# Patient Record
Sex: Female | Born: 1945 | Race: Black or African American | Hispanic: No | Smoking: Never smoker
Health system: Southern US, Community
[De-identification: ages and names within clinical notes are randomized; demographics above are authoritative.]

## PROBLEM LIST (undated history)

## (undated) DIAGNOSIS — E119 Type 2 diabetes mellitus without complications: Secondary | ICD-10-CM

## (undated) DIAGNOSIS — C801 Malignant (primary) neoplasm, unspecified: Secondary | ICD-10-CM

## (undated) DIAGNOSIS — I10 Essential (primary) hypertension: Secondary | ICD-10-CM

## (undated) DIAGNOSIS — E7800 Pure hypercholesterolemia, unspecified: Secondary | ICD-10-CM

## (undated) DIAGNOSIS — M25569 Pain in unspecified knee: Secondary | ICD-10-CM

## (undated) DIAGNOSIS — M797 Fibromyalgia: Secondary | ICD-10-CM

## (undated) DIAGNOSIS — N289 Disorder of kidney and ureter, unspecified: Secondary | ICD-10-CM

## (undated) DIAGNOSIS — G8929 Other chronic pain: Secondary | ICD-10-CM

## (undated) HISTORY — PX: TUBAL LIGATION: SHX77

## (undated) HISTORY — PX: ABDOMINAL HYSTERECTOMY: SHX81

---

## 2003-11-25 ENCOUNTER — Ambulatory Visit: Payer: Self-pay | Admitting: Family Medicine

## 2005-05-23 ENCOUNTER — Ambulatory Visit: Payer: Self-pay | Admitting: Family Medicine

## 2010-05-16 ENCOUNTER — Ambulatory Visit: Payer: Self-pay | Admitting: Internal Medicine

## 2012-01-08 DIAGNOSIS — C801 Malignant (primary) neoplasm, unspecified: Secondary | ICD-10-CM

## 2012-01-08 HISTORY — DX: Malignant (primary) neoplasm, unspecified: C80.1

## 2013-08-27 ENCOUNTER — Inpatient Hospital Stay (HOSPITAL_COMMUNITY): Payer: Medicare Other

## 2013-08-27 ENCOUNTER — Emergency Department (HOSPITAL_COMMUNITY): Payer: Medicare Other

## 2013-08-27 ENCOUNTER — Inpatient Hospital Stay (HOSPITAL_COMMUNITY)
Admission: EM | Admit: 2013-08-27 | Discharge: 2013-09-07 | DRG: 374 | Disposition: E | Payer: Medicare Other | Attending: Emergency Medicine | Admitting: Emergency Medicine

## 2013-08-27 ENCOUNTER — Encounter (HOSPITAL_COMMUNITY): Payer: Self-pay | Admitting: Emergency Medicine

## 2013-08-27 DIAGNOSIS — I469 Cardiac arrest, cause unspecified: Secondary | ICD-10-CM | POA: Diagnosis present

## 2013-08-27 DIAGNOSIS — R6520 Severe sepsis without septic shock: Secondary | ICD-10-CM

## 2013-08-27 DIAGNOSIS — E875 Hyperkalemia: Secondary | ICD-10-CM

## 2013-08-27 DIAGNOSIS — IMO0001 Reserved for inherently not codable concepts without codable children: Secondary | ICD-10-CM | POA: Diagnosis present

## 2013-08-27 DIAGNOSIS — N185 Chronic kidney disease, stage 5: Secondary | ICD-10-CM | POA: Diagnosis present

## 2013-08-27 DIAGNOSIS — N2889 Other specified disorders of kidney and ureter: Secondary | ICD-10-CM | POA: Diagnosis present

## 2013-08-27 DIAGNOSIS — K56609 Unspecified intestinal obstruction, unspecified as to partial versus complete obstruction: Secondary | ICD-10-CM | POA: Diagnosis present

## 2013-08-27 DIAGNOSIS — J449 Chronic obstructive pulmonary disease, unspecified: Secondary | ICD-10-CM | POA: Diagnosis present

## 2013-08-27 DIAGNOSIS — D630 Anemia in neoplastic disease: Secondary | ICD-10-CM | POA: Diagnosis present

## 2013-08-27 DIAGNOSIS — K559 Vascular disorder of intestine, unspecified: Secondary | ICD-10-CM | POA: Diagnosis not present

## 2013-08-27 DIAGNOSIS — Z923 Personal history of irradiation: Secondary | ICD-10-CM | POA: Diagnosis not present

## 2013-08-27 DIAGNOSIS — M25569 Pain in unspecified knee: Secondary | ICD-10-CM | POA: Diagnosis present

## 2013-08-27 DIAGNOSIS — E8729 Other acidosis: Secondary | ICD-10-CM

## 2013-08-27 DIAGNOSIS — C7800 Secondary malignant neoplasm of unspecified lung: Secondary | ICD-10-CM | POA: Diagnosis present

## 2013-08-27 DIAGNOSIS — Z794 Long term (current) use of insulin: Secondary | ICD-10-CM

## 2013-08-27 DIAGNOSIS — K5900 Constipation, unspecified: Secondary | ICD-10-CM | POA: Diagnosis present

## 2013-08-27 DIAGNOSIS — R6521 Severe sepsis with septic shock: Secondary | ICD-10-CM

## 2013-08-27 DIAGNOSIS — E1129 Type 2 diabetes mellitus with other diabetic kidney complication: Secondary | ICD-10-CM

## 2013-08-27 DIAGNOSIS — J690 Pneumonitis due to inhalation of food and vomit: Secondary | ICD-10-CM | POA: Diagnosis not present

## 2013-08-27 DIAGNOSIS — R5383 Other fatigue: Secondary | ICD-10-CM | POA: Diagnosis not present

## 2013-08-27 DIAGNOSIS — N390 Urinary tract infection, site not specified: Secondary | ICD-10-CM | POA: Diagnosis present

## 2013-08-27 DIAGNOSIS — A419 Sepsis, unspecified organism: Secondary | ICD-10-CM | POA: Diagnosis not present

## 2013-08-27 DIAGNOSIS — I959 Hypotension, unspecified: Secondary | ICD-10-CM | POA: Diagnosis present

## 2013-08-27 DIAGNOSIS — C772 Secondary and unspecified malignant neoplasm of intra-abdominal lymph nodes: Secondary | ICD-10-CM | POA: Diagnosis present

## 2013-08-27 DIAGNOSIS — I120 Hypertensive chronic kidney disease with stage 5 chronic kidney disease or end stage renal disease: Secondary | ICD-10-CM | POA: Diagnosis present

## 2013-08-27 DIAGNOSIS — Z7982 Long term (current) use of aspirin: Secondary | ICD-10-CM

## 2013-08-27 DIAGNOSIS — C787 Secondary malignant neoplasm of liver and intrahepatic bile duct: Secondary | ICD-10-CM | POA: Diagnosis present

## 2013-08-27 DIAGNOSIS — E872 Acidosis, unspecified: Secondary | ICD-10-CM | POA: Diagnosis present

## 2013-08-27 DIAGNOSIS — J9600 Acute respiratory failure, unspecified whether with hypoxia or hypercapnia: Secondary | ICD-10-CM | POA: Diagnosis present

## 2013-08-27 DIAGNOSIS — C786 Secondary malignant neoplasm of retroperitoneum and peritoneum: Secondary | ICD-10-CM | POA: Diagnosis present

## 2013-08-27 DIAGNOSIS — Z66 Do not resuscitate: Secondary | ICD-10-CM | POA: Diagnosis present

## 2013-08-27 DIAGNOSIS — C773 Secondary and unspecified malignant neoplasm of axilla and upper limb lymph nodes: Secondary | ICD-10-CM | POA: Diagnosis present

## 2013-08-27 DIAGNOSIS — Z888 Allergy status to other drugs, medicaments and biological substances status: Secondary | ICD-10-CM | POA: Diagnosis not present

## 2013-08-27 DIAGNOSIS — Z8542 Personal history of malignant neoplasm of other parts of uterus: Secondary | ICD-10-CM | POA: Diagnosis not present

## 2013-08-27 DIAGNOSIS — R627 Adult failure to thrive: Secondary | ICD-10-CM | POA: Diagnosis present

## 2013-08-27 DIAGNOSIS — N1330 Unspecified hydronephrosis: Secondary | ICD-10-CM | POA: Diagnosis present

## 2013-08-27 DIAGNOSIS — N170 Acute kidney failure with tubular necrosis: Secondary | ICD-10-CM | POA: Diagnosis not present

## 2013-08-27 DIAGNOSIS — J4489 Other specified chronic obstructive pulmonary disease: Secondary | ICD-10-CM | POA: Diagnosis present

## 2013-08-27 DIAGNOSIS — D631 Anemia in chronic kidney disease: Secondary | ICD-10-CM

## 2013-08-27 DIAGNOSIS — R5381 Other malaise: Secondary | ICD-10-CM | POA: Diagnosis present

## 2013-08-27 DIAGNOSIS — N039 Chronic nephritic syndrome with unspecified morphologic changes: Secondary | ICD-10-CM | POA: Diagnosis present

## 2013-08-27 DIAGNOSIS — E785 Hyperlipidemia, unspecified: Secondary | ICD-10-CM | POA: Diagnosis present

## 2013-08-27 DIAGNOSIS — I9589 Other hypotension: Secondary | ICD-10-CM

## 2013-08-27 DIAGNOSIS — N19 Unspecified kidney failure: Secondary | ICD-10-CM

## 2013-08-27 DIAGNOSIS — E1122 Type 2 diabetes mellitus with diabetic chronic kidney disease: Secondary | ICD-10-CM

## 2013-08-27 DIAGNOSIS — G8929 Other chronic pain: Secondary | ICD-10-CM | POA: Diagnosis present

## 2013-08-27 DIAGNOSIS — N189 Chronic kidney disease, unspecified: Secondary | ICD-10-CM

## 2013-08-27 DIAGNOSIS — D649 Anemia, unspecified: Secondary | ICD-10-CM

## 2013-08-27 DIAGNOSIS — N179 Acute kidney failure, unspecified: Secondary | ICD-10-CM

## 2013-08-27 DIAGNOSIS — E119 Type 2 diabetes mellitus without complications: Secondary | ICD-10-CM | POA: Diagnosis present

## 2013-08-27 HISTORY — DX: Disorder of kidney and ureter, unspecified: N28.9

## 2013-08-27 HISTORY — DX: Fibromyalgia: M79.7

## 2013-08-27 HISTORY — DX: Type 2 diabetes mellitus without complications: E11.9

## 2013-08-27 HISTORY — DX: Other chronic pain: G89.29

## 2013-08-27 HISTORY — DX: Malignant (primary) neoplasm, unspecified: C80.1

## 2013-08-27 HISTORY — DX: Pain in unspecified knee: M25.569

## 2013-08-27 HISTORY — DX: Essential (primary) hypertension: I10

## 2013-08-27 HISTORY — DX: Pure hypercholesterolemia, unspecified: E78.00

## 2013-08-27 LAB — CBC WITH DIFFERENTIAL/PLATELET
BASOS PCT: 0 % (ref 0–1)
Basophils Absolute: 0 10*3/uL (ref 0.0–0.1)
EOS PCT: 0 % (ref 0–5)
Eosinophils Absolute: 0 10*3/uL (ref 0.0–0.7)
HCT: 18 % — ABNORMAL LOW (ref 36.0–46.0)
HEMOGLOBIN: 5.6 g/dL — AB (ref 12.0–15.0)
Lymphocytes Relative: 7 % — ABNORMAL LOW (ref 12–46)
Lymphs Abs: 0.7 10*3/uL (ref 0.7–4.0)
MCH: 23.3 pg — ABNORMAL LOW (ref 26.0–34.0)
MCHC: 31.1 g/dL (ref 30.0–36.0)
MCV: 75 fL — ABNORMAL LOW (ref 78.0–100.0)
MONO ABS: 1.4 10*3/uL — AB (ref 0.1–1.0)
Monocytes Relative: 15 % — ABNORMAL HIGH (ref 3–12)
NEUTROS PCT: 78 % — AB (ref 43–77)
Neutro Abs: 7.2 10*3/uL (ref 1.7–7.7)
Platelets: 342 10*3/uL (ref 150–400)
RBC: 2.4 MIL/uL — AB (ref 3.87–5.11)
RDW: 16.8 % — ABNORMAL HIGH (ref 11.5–15.5)
WBC Morphology: INCREASED
WBC: 9.3 10*3/uL (ref 4.0–10.5)

## 2013-08-27 LAB — COMPREHENSIVE METABOLIC PANEL
ALBUMIN: 2.3 g/dL — AB (ref 3.5–5.2)
ALK PHOS: 90 U/L (ref 39–117)
ALT: 6 U/L (ref 0–35)
AST: 14 U/L (ref 0–37)
Anion gap: 30 — ABNORMAL HIGH (ref 5–15)
BUN: 163 mg/dL — ABNORMAL HIGH (ref 6–23)
CALCIUM: 7.9 mg/dL — AB (ref 8.4–10.5)
CO2: 13 mEq/L — ABNORMAL LOW (ref 19–32)
Chloride: 98 mEq/L (ref 96–112)
Creatinine, Ser: 11.24 mg/dL — ABNORMAL HIGH (ref 0.50–1.10)
GFR calc non Af Amer: 3 mL/min — ABNORMAL LOW (ref >90)
GFR, EST AFRICAN AMERICAN: 4 mL/min — AB (ref >90)
GLUCOSE: 144 mg/dL — AB (ref 70–99)
Potassium: 6.8 mEq/L (ref 3.7–5.3)
SODIUM: 141 meq/L (ref 137–147)
TOTAL PROTEIN: 6 g/dL (ref 6.0–8.3)
Total Bilirubin: 0.6 mg/dL (ref 0.3–1.2)

## 2013-08-27 LAB — PREPARE RBC (CROSSMATCH)

## 2013-08-27 LAB — BASIC METABOLIC PANEL
ANION GAP: 30 — AB (ref 5–15)
BUN: 164 mg/dL — ABNORMAL HIGH (ref 6–23)
CHLORIDE: 100 meq/L (ref 96–112)
CO2: 11 mEq/L — ABNORMAL LOW (ref 19–32)
Calcium: 8.4 mg/dL (ref 8.4–10.5)
Creatinine, Ser: 10.91 mg/dL — ABNORMAL HIGH (ref 0.50–1.10)
GFR, EST AFRICAN AMERICAN: 4 mL/min — AB (ref >90)
GFR, EST NON AFRICAN AMERICAN: 3 mL/min — AB (ref >90)
Glucose, Bld: 105 mg/dL — ABNORMAL HIGH (ref 70–99)
POTASSIUM: 6.6 meq/L — AB (ref 3.7–5.3)
SODIUM: 141 meq/L (ref 137–147)

## 2013-08-27 LAB — URINE MICROSCOPIC-ADD ON

## 2013-08-27 LAB — CBG MONITORING, ED
GLUCOSE-CAPILLARY: 73 mg/dL (ref 70–99)
Glucose-Capillary: 70 mg/dL (ref 70–99)

## 2013-08-27 LAB — URINALYSIS, ROUTINE W REFLEX MICROSCOPIC
Glucose, UA: NEGATIVE mg/dL
Ketones, ur: NEGATIVE mg/dL
Nitrite: NEGATIVE
PH: 5 (ref 5.0–8.0)
Protein, ur: 100 mg/dL — AB
Specific Gravity, Urine: 1.019 (ref 1.005–1.030)
UROBILINOGEN UA: 0.2 mg/dL (ref 0.0–1.0)

## 2013-08-27 LAB — I-STAT CG4 LACTIC ACID, ED: LACTIC ACID, VENOUS: 1 mmol/L (ref 0.5–2.2)

## 2013-08-27 LAB — ABO/RH: ABO/RH(D): A POS

## 2013-08-27 LAB — PHOSPHORUS: PHOSPHORUS: 10.8 mg/dL — AB (ref 2.3–4.6)

## 2013-08-27 LAB — GLUCOSE, CAPILLARY
GLUCOSE-CAPILLARY: 81 mg/dL (ref 70–99)
GLUCOSE-CAPILLARY: 121 mg/dL — AB (ref 70–99)

## 2013-08-27 LAB — MRSA PCR SCREENING: MRSA by PCR: POSITIVE — AB

## 2013-08-27 LAB — TROPONIN I

## 2013-08-27 LAB — I-STAT TROPONIN, ED: TROPONIN I, POC: 0 ng/mL (ref 0.00–0.08)

## 2013-08-27 MED ORDER — ONDANSETRON HCL 4 MG/2ML IJ SOLN: 4.0000 mg | Freq: Four times a day (QID) | INTRAMUSCULAR | Status: DC | PRN

## 2013-08-27 MED ORDER — SODIUM CHLORIDE 0.9 % IV BOLUS (SEPSIS)
1000.0000 mL | Freq: Once | INTRAVENOUS | Status: AC
  Administered 2013-08-27: 1000 mL via INTRAVENOUS

## 2013-08-27 MED ORDER — CHLORHEXIDINE GLUCONATE CLOTH 2 % EX PADS
6.0000 | MEDICATED_PAD | Freq: Every day | CUTANEOUS | Status: DC
  Administered 2013-08-28 – 2013-08-29 (×2): 6 via TOPICAL

## 2013-08-27 MED ORDER — MUPIROCIN 2 % EX OINT
1.0000 "application " | TOPICAL_OINTMENT | Freq: Two times a day (BID) | CUTANEOUS | Status: DC
  Administered 2013-08-27 – 2013-08-29 (×4): 1 via NASAL
  Filled 2013-08-27: qty 22

## 2013-08-27 MED ORDER — ACETAMINOPHEN 650 MG RE SUPP: 650.0000 mg | Freq: Four times a day (QID) | RECTAL | Status: DC | PRN

## 2013-08-27 MED ORDER — FUROSEMIDE 10 MG/ML IJ SOLN
120.0000 mg | Freq: Once | INTRAVENOUS | Status: AC
  Administered 2013-08-27: 120 mg via INTRAVENOUS
  Filled 2013-08-27: qty 12

## 2013-08-27 MED ORDER — CEFAZOLIN SODIUM 1-5 GM-% IV SOLN
1.0000 g | INTRAVENOUS | Status: DC
  Filled 2013-08-27: qty 50

## 2013-08-27 MED ORDER — INSULIN ASPART 100 UNIT/ML ~~LOC~~ SOLN
0.0000 [IU] | Freq: Three times a day (TID) | SUBCUTANEOUS | Status: DC
  Administered 2013-08-28: 2 [IU] via SUBCUTANEOUS
  Administered 2013-08-28: 1 [IU] via SUBCUTANEOUS
  Administered 2013-08-29 (×2): 2 [IU] via SUBCUTANEOUS

## 2013-08-27 MED ORDER — HEPARIN SODIUM (PORCINE) 5000 UNIT/ML IJ SOLN
5000.0000 [IU] | Freq: Three times a day (TID) | INTRAMUSCULAR | Status: DC
  Administered 2013-08-27 – 2013-08-29 (×5): 5000 [IU] via SUBCUTANEOUS
  Filled 2013-08-27 (×7): qty 1

## 2013-08-27 MED ORDER — SODIUM CHLORIDE 0.9 % IV SOLN
10.0000 mL/h | Freq: Once | INTRAVENOUS | Status: AC
  Administered 2013-08-27: 10 mL/h via INTRAVENOUS

## 2013-08-27 MED ORDER — SODIUM POLYSTYRENE SULFONATE 15 GM/60ML PO SUSP
60.0000 g | Freq: Once | ORAL | Status: AC
  Administered 2013-08-27: 60 g via ORAL
  Filled 2013-08-27 (×2): qty 240

## 2013-08-27 MED ORDER — SODIUM BICARBONATE 8.4 % IV SOLN
50.0000 meq | Freq: Once | INTRAVENOUS | Status: AC
  Administered 2013-08-27: 50 meq via INTRAVENOUS
  Filled 2013-08-27: qty 50

## 2013-08-27 MED ORDER — SODIUM CHLORIDE 0.9 % IJ SOLN
3.0000 mL | Freq: Two times a day (BID) | INTRAMUSCULAR | Status: DC
  Administered 2013-08-28: 3 mL via INTRAVENOUS

## 2013-08-27 MED ORDER — DEXTROSE 5 % IV SOLN
1.0000 g | INTRAVENOUS | Status: DC
  Administered 2013-08-27: 1 g via INTRAVENOUS
  Filled 2013-08-27: qty 10

## 2013-08-27 MED ORDER — DEXTROSE 50 % IV SOLN
1.0000 | Freq: Once | INTRAVENOUS | Status: AC
  Administered 2013-08-27: 50 mL via INTRAVENOUS
  Filled 2013-08-27: qty 50

## 2013-08-27 MED ORDER — SODIUM CHLORIDE 0.9 % IV SOLN
1.0000 g | Freq: Once | INTRAVENOUS | Status: AC
  Administered 2013-08-27: 1 g via INTRAVENOUS
  Filled 2013-08-27: qty 10

## 2013-08-27 MED ORDER — ASPIRIN EC 81 MG PO TBEC
81.0000 mg | DELAYED_RELEASE_TABLET | Freq: Every day | ORAL | Status: DC
  Administered 2013-08-27: 81 mg via ORAL
  Filled 2013-08-27 (×3): qty 1

## 2013-08-27 MED ORDER — PANTOPRAZOLE SODIUM 40 MG IV SOLR
40.0000 mg | INTRAVENOUS | Status: DC
  Administered 2013-08-27: 40 mg via INTRAVENOUS
  Filled 2013-08-27: qty 40

## 2013-08-27 MED ORDER — SODIUM CHLORIDE 0.9 % IV BOLUS (SEPSIS): 250.0000 mL | INTRAVENOUS | Status: DC | PRN

## 2013-08-27 MED ORDER — INSULIN ASPART 100 UNIT/ML ~~LOC~~ SOLN: 0.0000 [IU] | Freq: Three times a day (TID) | SUBCUTANEOUS | Status: DC

## 2013-08-27 MED ORDER — ACETAMINOPHEN 325 MG PO TABS: 650.0000 mg | ORAL_TABLET | Freq: Four times a day (QID) | ORAL | Status: DC | PRN

## 2013-08-27 MED ORDER — INSULIN ASPART 100 UNIT/ML ~~LOC~~ SOLN: 3.0000 [IU] | Freq: Three times a day (TID) | SUBCUTANEOUS | Status: DC

## 2013-08-27 MED ORDER — ALBUTEROL SULFATE (2.5 MG/3ML) 0.083% IN NEBU
5.0000 mg | INHALATION_SOLUTION | Freq: Once | RESPIRATORY_TRACT | Status: AC
  Administered 2013-08-27: 5 mg via RESPIRATORY_TRACT
  Filled 2013-08-27: qty 6

## 2013-08-27 MED ORDER — INSULIN ASPART 100 UNIT/ML ~~LOC~~ SOLN: 0.0000 [IU] | Freq: Every day | SUBCUTANEOUS | Status: DC

## 2013-08-27 MED ORDER — INSULIN ASPART 100 UNIT/ML ~~LOC~~ SOLN
10.0000 [IU] | Freq: Once | SUBCUTANEOUS | Status: AC
  Administered 2013-08-27: 10 [IU] via INTRAVENOUS
  Filled 2013-08-27: qty 1

## 2013-08-27 MED ORDER — ONDANSETRON HCL 4 MG PO TABS: 4.0000 mg | ORAL_TABLET | Freq: Four times a day (QID) | ORAL | Status: DC | PRN

## 2013-08-27 MED ORDER — SODIUM BICARBONATE 8.4 % IV SOLN
25.0000 meq | Freq: Once | INTRAVENOUS | Status: DC
  Filled 2013-08-27: qty 50

## 2013-08-27 MED ORDER — FENTANYL CITRATE 0.05 MG/ML IJ SOLN
50.0000 ug | Freq: Once | INTRAMUSCULAR | Status: AC
  Administered 2013-08-27: 50 ug via INTRAVENOUS
  Filled 2013-08-27: qty 2

## 2013-08-27 MED ORDER — SODIUM POLYSTYRENE SULFONATE 15 GM/60ML PO SUSP
30.0000 g | Freq: Once | ORAL | Status: AC
  Administered 2013-08-28: 30 g via ORAL
  Filled 2013-08-27: qty 120

## 2013-08-27 NOTE — ED Notes (Signed)
Potassium 6.8. Dr. Leonides Schanz made aware

## 2013-08-27 NOTE — ED Notes (Addendum)
MD at bedside. 

## 2013-08-27 NOTE — ED Notes (Signed)
Lab results given to Dr.Ward. 

## 2013-08-27 NOTE — ED Notes (Addendum)
Romero Belling, RRRN regarding bed placement. States OK for stepdown. Jillian 3SRN updated.

## 2013-08-27 NOTE — Consult Note (Signed)
PULMONARY / CRITICAL CARE MEDICINE   Name: Wanda Ferguson MRN: [id number] DOB: December 20, 1945    ADMISSION DATE:  08/26/2013 CONSULTATION DATE:  8/21  REFERRING MD :  [facility]  INITIAL PRESENTATION:  49F with hx of DM, Htn and CKD (followed @ [facility]) admitted initially to [facility] service via ED with CC of weakness and fatigue and found to have worsening renal function, markedly elevated BUN and Cr, hyperkalemia(6.8), severe anemia (Hgb 5.6) and hypotension that improved with initiation of PRBCs. Due to constellation of problems, it was felt that she should be admitted to ICU. Therefore PCCM consult obtained  STUDIES:  8/21 Renal US:   SIGNIFICANT EVENTS:    HISTORY OF PRESENT ILLNESS:   49F with hx of DM, Htn and CKD (followed @ [facility]) admitted initially to [facility] service via ED with CC of weakness and fatigue and found to have worsening renal function, markedly elevated BUN and Cr, hyperkalemia(6.8), severe anemia (Hgb 5.6) and hypotension that improved with initiation of PRBCs. Due to constellation of problems, it was felt that she should be admitted to ICU. Therefore PCCM consult obtained  PAST MEDICAL HISTORY :  Past Medical History  Diagnosis Date  . Fibromyalgia   . Chronic knee pain   . Hypertension   . Renal disorder     stage 5 CKD  . Hypercholesteremia   . Diabetes mellitus without complication   . Cancer 2014    uterine   Past Surgical History  Procedure Laterality Date  . Abdominal hysterectomy    . Tubal ligation     Prior to Admission medications   Medication Sig Start Date End Date Taking? Authorizing Provider  amLODipine (NORVASC) 10 MG tablet Take 10 mg by mouth daily.   Yes Historical Provider, MD  aspirin EC 81 MG tablet Take 81 mg by mouth at bedtime.   Yes Historical Provider, MD  atenolol (TENORMIN) 100 MG tablet Take 100 mg by mouth daily.   Yes Historical Provider, MD  HYDRALAZINE HCL PO Take 1 tablet by mouth 3 (three) times daily.   Yes Historical Provider, MD   hydrochlorothiazide (HYDRODIURIL) 25 MG tablet Take 25 mg by mouth daily.   Yes Historical Provider, MD  insulin glargine (LANTUS) 100 UNIT/ML injection Inject 0-18 Units into the skin at bedtime.   Yes Historical Provider, MD  oxyCODONE-acetaminophen (PERCOCET) 10-325 MG per tablet Take 1 tablet by mouth every 3 (three) hours as needed for pain.   Yes Historical Provider, MD  sodium bicarbonate 650 MG tablet Take 650 mg by mouth 2 (two) times daily.   Yes Historical Provider, MD   Allergies  Allergen Reactions  . Zoloft [Sertraline Hcl]     Nausea /vomiting    FAMILY HISTORY:  No family history on file. SOCIAL HISTORY:  reports that she has never smoked. She does not have any smokeless tobacco history on file. She reports that she drinks alcohol. She reports that she does not use illicit drugs.  REVIEW OF SYSTEMS:  As per admission note. No palpitations or LOC. No fever, cough, resp secretions, hemoptysis, LE edema  SUBJECTIVE:   VITAL SIGNS: Temp:  [97.4 F (36.3 C)-98.3 F (36.8 C)] 97.5 F (36.4 C) (08/21 1610) Pulse Rate:  [64-70] 66 (08/21 1610) Resp:  [17-31] 21 (08/21 1610) BP: (83-114)/(39-61) 109/61 mmHg (08/21 1610) SpO2:  [93 %-100 %] 94 % (08/21 1610) HEMODYNAMICS:   VENTILATOR SETTINGS:   INTAKE / OUTPUT:  Intake/Output Summary (Last 24 hours) at 08/18/2013 1620 Last data filed  at 08/16/2013 1351  Gross per 24 hour  Intake    335 ml  Output      0 ml  Net    335 ml    PHYSICAL EXAMINATION: General:  NAD, oriented, + F/C Neuro: CNs intact, no focal deficits HEENT:  NCAT, PERRL Cardiovascular: reg, no M Lungs: diminished BS, no adventitious sounds Abdomen: obese, soft, NT, NABS Ext: warm, trace symmetric edema  LABS:  CBC  Recent Labs Lab 09/02/2013 1147  WBC 9.3  HGB 5.6*  HCT 18.0*  PLT 342   Coag's No results found for this basename: APTT, INR,  in the last 168 hours BMET  Recent Labs Lab 08/13/2013 1147  NA 141  K 6.8*  CL 98  CO2  13*  BUN 163*  CREATININE 11.24*  GLUCOSE 144*   Electrolytes  Recent Labs Lab 08/18/2013 1147  CALCIUM 7.9*   Sepsis Markers  Recent Labs Lab 08/24/2013 1200  LATICACIDVEN 1.00   ABG No results found for this basename: PHART, PCO2ART, PO2ART,  in the last 168 hours Liver Enzymes  Recent Labs Lab 08/19/2013 1147  AST 14  ALT 6  ALKPHOS 90  BILITOT 0.6  ALBUMIN 2.3*   Cardiac Enzymes No results found for this basename: TROPONINI, PROBNP,  in the last 168 hours Glucose  Recent Labs Lab 08/25/2013 1517  GLUCAP 70    CXR: low volumes, elevated L hemi-diaphragm, no overt edema or infiltrates, possible R suprahilar LAN, possible nodules bilaterally  ASSESSMENT / PLAN:  PULMONARY  A: Possible pulm nodules and LAN At risk for pulm edema with transfusion in setting of renal failure P:   SuppO2 as needed to maintain SpO2 > 92% PA/lat CXR when able Consider CT chest if CXR findings persist  CARDIOVASCULAR  A:  Hypotension, resolved EKG abnormalities - likely related to hyperkalemia P:  Monitor BP, rhythm Cycle cardiac markers  RENAL A:   Stage V CKD AKI, unclear etiology Acute on chronic metabolic acidosis due to renal failure Hyperkalemia P:   Medical rx for hyperkalemia given in ED Renal Consult obtained Will likely need initiation of HD Vasc Surgery contacted for HD acces by Renal Monitor BMET closely Monitor I/Os Correct electrolytes as indicated  GASTROINTESTINAL A:   ileus by KUB (exam unimpressive) P:   SUP: IV PPI Renal diet ordered Repeat KUB in AM 8/22  HEMATOLOGIC A:   Acute on chronic anemia without overt bleeding noted P:  2 units PRBCs ordered 8/21 DVT px: SQ heparin Monitor CBC intermittently Transfuse per usual ICU guidelines Hemoccult stools  INFECTIOUS A:  No overt infections identified Urine 8/21 >>  Blood 8/21 >>  P:   Monitor temp, WBC count Micro and abx as above  ENDOCRINE A:   DM2   P:   ACHS moderate  SSI scale with meal coverage  NEUROLOGIC A:   Adult FTT due to worsening renal failure P:   RASS goal: 0 Minimize sedation/opioids  TODAY'S SUMMARY:   I have personally obtained a history, examined the patient, evaluated laboratory and imaging results, formulated the assessment and plan and placed orders. CRITICAL CARE: The patient is critically ill with multiple organ systems failure and requires high complexity decision making for assessment and support, frequent evaluation and titration of therapies, application of advanced monitoring technologies and extensive interpretation of multiple databases. Critical Care Time devoted to patient care services described in this note is  minutes.   Merton Border, MD ; [facility] [phone].  After 5:30  PM or weekends, call [phone] Pulmonary and [place] Pager: [phone]  08/11/2013, 4:20 PM

## 2013-08-27 NOTE — ED Notes (Signed)
Patient offered use of bed pan but stated she did not feel the need to urinate at this time.

## 2013-08-27 NOTE — ED Notes (Signed)
Attempted report 

## 2013-08-27 NOTE — ED Notes (Signed)
Re-paged Dr. Marin Olp to [phone]

## 2013-08-27 NOTE — Consult Note (Signed)
Reason for Consult:Renal Failure/ Hyperkalemia Referring Physician: Pryor Curia, DO  Wanda Ferguson is an 68 y.o. female.  HPI: Patient with history of CKD Stage 5 (not on dialysis, no access), Uterine CA (S/p surgical resection in 2013 and XRT), HTN, IDDM, Chronic knee pain, Fibromyalgia.   Her Daughter is at her bedside and assist with history.  She presents with a 3 day history of worsening fatigue and malaise associated with nausea, vomiting x2 (NBNB, yesterday), dysuria and foul smelling urine. She reports she previously followed with Dr. Kathee Delton Nephrologist at [facility] and there were discussions about possible need for dialysis but never saw a vascular surgeon.  She has continued to follow with Dr. Georgette Dover for Hx of Uterine CA and last saw him 4 months ago.  She apparently has not seen a nephrologist again agter Dr. Kathee Delton retired over a year ago.  Her daughter reports that her previous renal function was "12%."  She denies any decreased UOP and decreased ability to urinate.  Trend in Creatinine: Creatinine, Ser  Date/Time Value Ref Range Status  08/10/2013 11:47 AM 11.24* 0.50 - 1.10 mg/dL Final    PMH:   Past Medical History  Diagnosis Date  . Fibromyalgia   . Chronic knee pain   . Hypertension   . Renal disorder     stage 5 CKD  . Hypercholesteremia   . Diabetes mellitus without complication   . Cancer 2014    uterine    PSH:   Past Surgical History  Procedure Laterality Date  . Abdominal hysterectomy    . Tubal ligation      Allergies:  Allergies  Allergen Reactions  . Zoloft [Sertraline Hcl]     Nausea /vomiting    Medications:   Prior to Admission medications   Medication Sig Start Date End Date Taking? Authorizing Provider  amLODipine (NORVASC) 10 MG tablet Take 10 mg by mouth daily.   Yes Historical Provider, MD  aspirin EC 81 MG tablet Take 81 mg by mouth at bedtime.   Yes Historical Provider, MD  atenolol (TENORMIN) 100 MG tablet Take 100 mg by mouth daily.    Yes Historical Provider, MD  HYDRALAZINE HCL PO Take 1 tablet by mouth 3 (three) times daily.   Yes Historical Provider, MD  hydrochlorothiazide (HYDRODIURIL) 25 MG tablet Take 25 mg by mouth daily.   Yes Historical Provider, MD  insulin glargine (LANTUS) 100 UNIT/ML injection Inject 0-18 Units into the skin at bedtime.   Yes Historical Provider, MD  oxyCODONE-acetaminophen (PERCOCET) 10-325 MG per tablet Take 1 tablet by mouth every 3 (three) hours as needed for pain.   Yes Historical Provider, MD  sodium bicarbonate 650 MG tablet Take 650 mg by mouth 2 (two) times daily.   Yes Historical Provider, MD    Inpatient medications:   Discontinued Meds:  There are no discontinued medications.  Social History:  reports that she has never smoked. She does not have any smokeless tobacco history on file. She reports that she drinks alcohol. She reports that she does not use illicit drugs.  Family History:  No family history on file.  Review of Systems  Constitutional: Positive for malaise/fatigue. Negative for fever and chills.  Eyes: Negative for blurred vision.  Respiratory: Positive for wheezing. Negative for cough and shortness of breath.   Cardiovascular: Negative for chest pain.  Gastrointestinal: Positive for nausea, vomiting and constipation. Negative for diarrhea, blood in stool and melena.  Genitourinary: Positive for dysuria.  Skin: Positive for itching.  Neurological: Positive for dizziness (on standing) and weakness. Negative for focal weakness.  All other systems reviewed and are negative.   Weight change:   Intake/Output Summary (Last 24 hours) at 08/31/2013 1627 Last data filed at 08/22/2013 1351  Gross per 24 hour  Intake    335 ml  Output      0 ml  Net    335 ml   BP 109/61  Pulse 66  Temp(Src) 97.5 F (36.4 C) (Oral)  Resp 21  SpO2 94% Filed Vitals:   08/23/2013 1545 08/08/2013 1550 08/21/2013 1600 08/28/2013 1610  BP:  87/50 114/55 109/61  Pulse: 66 65 67 66  Temp:     97.5 F (36.4 C)  TempSrc:    Oral  Resp: 21 18 23 21   SpO2: 96% 93% 93% 94%     General: resting in bed, frail elderly female HEENT: PERRL, EOMI Cardiac: RRR, no rub Pulm: bibasilar rales, wheezing bilaterally Abd: soft, nontender, nondistended, BS present Ext: warm and well perfused, 1+ pedal edema Neuro: no asterixis, no focal deficits appreciated  Labs: Basic Metabolic Panel:  Recent Labs Lab 08/31/2013 1147  NA 141  K 6.8*  CL 98  CO2 13*  GLUCOSE 144*  BUN 163*  CREATININE 11.24*  ALBUMIN 2.3*  CALCIUM 7.9*   Liver Function Tests:  Recent Labs Lab 08/20/2013 1147  AST 14  ALT 6  ALKPHOS 90  BILITOT 0.6  PROT 6.0  ALBUMIN 2.3*   No results found for this basename: LIPASE, AMYLASE,  in the last 168 hours No results found for this basename: AMMONIA,  in the last 168 hours CBC:  Recent Labs Lab 08/21/2013 1147  WBC 9.3  NEUTROABS 7.2  HGB 5.6*  HCT 18.0*  MCV 75.0*  PLT 342   PT/INR: @LABRCNTIP (inr:5) Cardiac Enzymes: )No results found for this basename: CKTOTAL, CKMB, CKMBINDEX, TROPONINI,  in the last 168 hours CBG:  Recent Labs Lab 08/23/2013 1517  GLUCAP 70    Iron Studies: No results found for this basename: IRON, TIBC, TRANSFERRIN, FERRITIN,  in the last 168 hours  Xrays/Other Studies: Dg Abd Acute W/chest  08/13/2013   CLINICAL DATA:  Abdominal pain and constipation.  EXAM: ACUTE ABDOMEN SERIES (ABDOMEN 2 VIEW & CHEST 1 VIEW)  COMPARISON:  None.  FINDINGS: Lung volumes are markedly low with some basilar atelectasis. A 0.9 cm nodular opacity is seen in the left upper lobe. A second nodular opacity in the right mid lung measures 1.0 cm. Heart size appears enlarged. Right paratracheal soft tissue fullness is identified.  Two views of the abdomen show no free intraperitoneal air. There is gaseous distention of small bowel loops of the 4.6 cm. A large volume of stool is seen throughout the colon.  IMPRESSION: Bilateral nodular opacities in the chest  and fullness of the right paratracheal stripe. CT chest with contrast is recommended for further evaluation.  Abnormal bowel gas pattern could be due to partial small bowel obstruction or ileus.  Large volume of stool throughout the colon.   Electronically Signed   By: Inge Rise M.D.   On: 08/24/2013 12:58     Assessment/Plan: 1.  Acute on CKD Stage 5- patient may need dialysis later tonight or tomorrow.  She has advance kidney disease and is without access. Dr Trula Slade, [facility], has been consulted to schedule permacath placement (tentativly scheduled for 8/22).  She does not currently have uremic symptoms and will try to hold off on dialysis although she may need dialysis  later today.  Her acute worsening may be due to ischemic ATN from her hypotension however given her history of Uterine CA she needs a STAT renal U/S to r/o obstruction.  We need records from [facility] however currently unavailable in Care Everywhere, access ID paperwork has been faxed to [facility] by ED secretary. 1. Unclear if this is acute on chronic or if this is progressive, however her daughter reports that they were told her mother's kidney function was "stable" and the GFR was 13 approximately 3 months ago and now with marked pre-renal azotemia, hyperkalemia, and profound anemia.  Need to r/o obstruction given h/o uterine CA and XRT and may benefit from Percutaneous nephrostomy tubes rather than initiation of dialysis if she truly has obstruction. 2. If no obstruction, will proceed with HD after [facility] placed by [facility] tomorrow 2. 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  Patient currently on 1st unit PRBC, has 2 ordered.  Continue transfusions per primary team.  Fecal occult ordered. 3. Hyperkalemia: Patient does not have peaked T waves on EKG (only on V2).  She has received IVF, Albuterol, and Insulin.  Will give 60mg  of kayexalate now,  and 120mg  IV lasix between units of PRBCs.  If not improving will need to place temporary dialysis catheter and undergo urgent dialysis. 4. Increase Anion Gap Metabolic Acidosis: AG of 30, Bicarb 13.  Repeat labs ordered. If not improved will need bicarb to correct acidosis and improve hyperkalemia. 5. Hypotension: U/A with pyuria, + bandemia, tachypnea. No fever or tachycardia.  Has received 2L of NS, Blood Cultures and Urine Cultures obtained.  Blood Pressure is improved and Lactic acid wnl.  Management per primary/ PCCM  1. Agree with Dr. Heber Rhine to consider empiric antibiotics given SIRS type presentation with pyuria and hypotension. 6. HTN: Hold antihypertensives 7. IDDM: per primary 8. Hx of Uterine CA: Renal U/S to r/o obstruction   Lucious Groves 08/28/2013, 4:27 PM   I have seen and examined this patient and agree with plan as outlined by Dr. Heber [place] except for the additions added above.  Discussed case with Dr. Alva Garnet of PCCM and will cont to follow closely. Yoshika Vensel A,MD 08/22/2013 5:05 PM

## 2013-08-27 NOTE — ED Notes (Signed)
Pt c/o constipation x 3 days, sts hx of this because she takes iron pills and hydrocodone at home for her chronic pain. Pt denies taking more pain medicine than usual recently. C/o generalized abd pain and weakness. sts she had a BM yesterday but it was very hard. C/o intermittent nausea today, no vomiting today. sts she vomited yesterday. Nad, skin warm and dry, resp e/u.

## 2013-08-27 NOTE — H&P (Signed)
[facility] History and Physical  Wanda Ferguson BTY:[id number] DOB: [birth year] DOA: 09/02/2013  Referring physician:  PCP: Ellamae Sia, MD  Specialists:   Chief Complaint: weakness, fatigue   HPI: Wanda Ferguson is a 68 y.o. female with PMH of IDDM, HTN, CKD-V (not on HD), anemia presented with generalized weakness, fatigue, nausea,leg edema and found to have hypotension, anemia with Hg -5.6, abnormal renal function K-6.8, Cr-11.2, Bun-163; Patient reports mild SOB, DOE, denies chest pain, no palpitations, no dizziness, no syncope, no focal neurological symptoms;  -Patient is being Tfsed 2 units in ED, given IV insulin, D50%, Ca; ED d/w nephrology, and hospitalist called for admission   Review of Systems: The patient denies anorexia, fever, weight loss,, vision loss, decreased hearing, hoarseness, chest pain, syncope, dyspnea on exertion, peripheral edema, balance deficits, hemoptysis, abdominal pain, melena, hematochezia, severe indigestion/heartburn, hematuria, incontinence, genital sores, muscle weakness, suspicious skin lesions, transient blindness, difficulty walking, depression, unusual weight change, abnormal bleeding, enlarged lymph nodes, angioedema, and breast masses.   Past Medical History  Diagnosis Date  . Fibromyalgia   . Chronic knee pain   . Hypertension   . Renal disorder     stage 5 CKD  . Hypercholesteremia   . Diabetes mellitus without complication   . Cancer 2014    uterine   Past Surgical History  Procedure Laterality Date  . Abdominal hysterectomy    . Tubal ligation     Social History:  reports that she has never smoked. She does not have any smokeless tobacco history on file. She reports that she drinks alcohol. She reports that she does not use illicit drugs. Home;  where does patient live--home, ALF, SNF? and with whom if at home? Yes'  Can patient participate in ADLs?  Allergies  Allergen Reactions  . Zoloft [Sertraline Hcl]      Nausea /vomiting    No family history on file.  (be sure to complete)  Prior to Admission medications   Medication Sig Start Date End Date Taking? Authorizing Provider  amLODipine (NORVASC) 10 MG tablet Take 10 mg by mouth daily.   Yes Historical Provider, MD  aspirin EC 81 MG tablet Take 81 mg by mouth at bedtime.   Yes Historical Provider, MD  atenolol (TENORMIN) 100 MG tablet Take 100 mg by mouth daily.   Yes Historical Provider, MD  HYDRALAZINE HCL PO Take 1 tablet by mouth 3 (three) times daily.   Yes Historical Provider, MD  hydrochlorothiazide (HYDRODIURIL) 25 MG tablet Take 25 mg by mouth daily.   Yes Historical Provider, MD  insulin glargine (LANTUS) 100 UNIT/ML injection Inject 0-18 Units into the skin at bedtime.   Yes Historical Provider, MD  oxyCODONE-acetaminophen (PERCOCET) 10-325 MG per tablet Take 1 tablet by mouth every 3 (three) hours as needed for pain.   Yes Historical Provider, MD  sodium bicarbonate 650 MG tablet Take 650 mg by mouth 2 (two) times daily.   Yes Historical Provider, MD   Physical Exam: Filed Vitals:   09/05/2013 1500  BP: 102/59  Pulse: 69  Temp: 97.5 F (36.4 C)  Resp: 20     General:  alert  Eyes: eom-i, perrla   ENT: no oral ulcers  Neck: supple, +JVD  Cardiovascular: s1,s2 syst mr,   Respiratory: LL crackles   Abdomen: soft, nt,nd   Skin: no rash   Musculoskeletal: LE edema  Psychiatric: no hallucinations   Neurologic: CN 2-12 intact; motor 5/5 BL symmetric   Labs on Admission:  Basic Metabolic Panel:  Recent Labs Lab 08/26/2013 1147  NA 141  K 6.8*  CL 98  CO2 13*  GLUCOSE 144*  BUN 163*  CREATININE 11.24*  CALCIUM 7.9*   Liver Function Tests:  Recent Labs Lab 09/01/2013 1147  AST 14  ALT 6  ALKPHOS 90  BILITOT 0.6  PROT 6.0  ALBUMIN 2.3*   No results found for this basename: LIPASE, AMYLASE,  in the last 168 hours No results found for this basename: AMMONIA,  in the last 168 hours CBC:  Recent  Labs Lab 08/25/2013 1147  WBC 9.3  NEUTROABS 7.2  HGB 5.6*  HCT 18.0*  MCV 75.0*  PLT 342   Cardiac Enzymes: No results found for this basename: CKTOTAL, CKMB, CKMBINDEX, TROPONINI,  in the last 168 hours  BNP (last 3 results) No results found for this basename: PROBNP,  in the last 8760 hours CBG: No results found for this basename: GLUCAP,  in the last 168 hours  Radiological Exams on Admission: Dg Abd Acute W/chest  09/04/2013   CLINICAL DATA:  Abdominal pain and constipation.  EXAM: ACUTE ABDOMEN SERIES (ABDOMEN 2 VIEW & CHEST 1 VIEW)  COMPARISON:  None.  FINDINGS: Lung volumes are markedly low with some basilar atelectasis. A 0.9 cm nodular opacity is seen in the left upper lobe. A second nodular opacity in the right mid lung measures 1.0 cm. Heart size appears enlarged. Right paratracheal soft tissue fullness is identified.  Two views of the abdomen show no free intraperitoneal air. There is gaseous distention of small bowel loops of the 4.6 cm. A large volume of stool is seen throughout the colon.  IMPRESSION: Bilateral nodular opacities in the chest and fullness of the right paratracheal stripe. CT chest with contrast is recommended for further evaluation.  Abnormal bowel gas pattern could be due to partial small bowel obstruction or ileus.  Large volume of stool throughout the colon.   Electronically Signed   By: Inge Rise M.D.   On: 08/21/2013 12:58    EKG: Independently reviewed.   Assessment/Plan Principal Problem:   Hypotension Active Problems:   CKD (chronic kidney disease) stage 5, GFR less than 15 ml/min   DM (diabetes mellitus)   Hyperkalemia  68 y.o. female with PMH of IDDM, HTN, CKD-V (not on HD), anemia presented with generalized weakness, fatigue, nausea,leg edema and found to have hypotension, anemia with Hg -5.6, abnormal renal function K-6.8, Cr-11.2, Bun-163;  1. Acute on chronic anemia likely due to renal; no s/s of acute bleeding   -Tsing two units  in ED; monitor Hg, TF prn; check occult blood, iron profile   2. AG acidosis, Hyper K-6.8 likely due to progressive CKD-V; ? Oliguric  -s/p IV insulin, Ca, d 50% in ED; Repeat BMP; may need IV bicarb if persistent hyper K; if no HD planned by nephrology; obtain renal US; -defer management to nephrology; appreciate the input  3. IDDM; no recent HA1c'; ISS for now; hold lantus due to progressive renal failure;  -check ha1c; resume lantus in AM 4. Suspected UTI; started IV atx, f/u cultures 5. Hypotension likely due to UTI, anemia;  -IVF gentle, close monitor volume status due to CKD-V; Tf PRBC prn; hold BP meds (resume in AM if stable);  6. Generalized weakness, fatigue likely due to uremia -f/u nephrology recommendations, ? Needs HD; obtain PT eval 7. Bilateral nodular opacities in the chest and fullness of the right paratracheal stripe on chest x ray;  -obtain CT chest  in AM   Nephrology;  if consultant consulted, please document name and whether formally or informally consulted  Code Status: full (must indicate code status--if unknown or must be presumed, indicate so) Family Communication: d/w patient, her daughter  (indicate person spoken with, if applicable, with phone number if by telephone) Disposition Plan: pend clinical improvement  (indicate anticipated LOS)  Time spent: >35 minutes   [facility] Pager [phone]  If 7PM-7AM, please contact night-coverage [email or web address] Password [facility] 08/26/2013, 3:07 PM

## 2013-08-27 NOTE — ED Notes (Signed)
Dr. Daleen Bo and Guy Begin RN confirm pt OK to come to unit at this time.

## 2013-08-27 NOTE — ED Notes (Signed)
Critical Care at bedside, states changing request to ICU bed at this time.

## 2013-08-27 NOTE — ED Notes (Signed)
Hemoglobin 5.6 from [facility] in Lab. Dr. Leonides Schanz aware.

## 2013-08-27 NOTE — ED Notes (Signed)
Pt's daughter reports foul urine along with the pt c/o dysuria x 3 days.

## 2013-08-27 NOTE — Progress Notes (Signed)
[facility] PROGRESS NOTE  Wanda Ferguson BJS:[id number] DOB: 1945-01-31 DOA: 09/04/2013 PCP: Ellamae Sia, MD  Patient is still borderline hypotensive despite NS bolused 2 liters, TFsing two units PRBcs; concerned about volume status, congestion with CKD;   -may need pressure support D/w critical care Kara Mead, who kindly agreed to see the patient. ? Monitor in ICU    [name]  [facility] Pager [phone]. If 7PM-7AM, please contact night-coverage at [email or web address], password [facility] 09/01/2013, 4:00 PM  LOS: 0 days

## 2013-08-27 NOTE — ED Notes (Signed)
Per EMS - pt coming from home, daughter at bedside. Pt c/o generalized weakness over 3 days. Pt vomited x 2 yesterday. Having constipation x 3 days, had hard BM yesterday. + orthostatic hypotension. EMS administered 500 ml of NS. Pt has chronic leg pain. Hx of renal issues, doesn't do dialysis at this time. 18 G in left AC. Initial BP 82 palpated sitting. Last BP 100/52. HR 69. 97% on 2 liters/min oxygen. 90% on room air. abd distended. Denies pain on palpation. Alert and oriented x 4.

## 2013-08-27 NOTE — Progress Notes (Signed)
Notified Renal MD of K of 6.6, member currently having bowel movement s/p kayexalate. Member to receive an additional 30 gram KayExalate. Will repeat AM BMET for K f/u.

## 2013-08-27 NOTE — ED Notes (Addendum)
Received call from [facility] to await Critical Care to see patient prior to transporting.

## 2013-08-27 NOTE — ED Notes (Signed)
Ultrasound at bedside

## 2013-08-27 NOTE — ED Notes (Signed)
Nephrology at bedside

## 2013-08-27 NOTE — ED Provider Notes (Signed)
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 She is also had constipation for the past 3 days which she contributes to taking Percocet chronically for her knee pain. She is passing gas. She has had 2 episodes of vomiting yesterday. No fevers or chills. No cough. No chest pain or shortness of breath. No numbness or focal weakness. No headache, neck pain or neck stiffness. She did have a small amount of or blood in her stool yesterday when she attempted to have a bowel movement secondary to her constipation. Her last bowel movement was yesterday. No melena. She states she has had multiple blood transfusions in the past. Patient was hypotensive with EMS.  ROS: See HPI Constitutional: no fever  Eyes: no drainage  ENT: no runny nose   Cardiovascular:  no chest pain  Resp: no SOB  GI:  vomiting GU: no dysuria Integumentary: no rash  Allergy: no hives  Musculoskeletal: no leg swelling  Neurological: no slurred speech ROS otherwise negative  PAST MEDICAL HISTORY/PAST SURGICAL HISTORY:  Past Medical History  Diagnosis Date  . Fibromyalgia   . Chronic knee pain   . Hypertension   . Renal disorder     stage 5 CKD  . Hypercholesteremia   . Diabetes mellitus without complication   . Cancer 2014    uterine    MEDICATIONS:  Prior to Admission medications   Not on File    ALLERGIES:  Allergies  Allergen Reactions  . Zoloft [Sertraline Hcl]     Nausea /vomiting    SOCIAL HISTORY:  History  Substance Use Topics  . Smoking status: Never Smoker   . Smokeless tobacco: Not on file  . Alcohol Use: Yes     Comment: occassionally    FAMILY HISTORY: No family history on  file.  EXAM: BP 101/53  Temp(Src) 98.3 F (36.8 C) (Oral)  Resp 25  SpO2 97% CONSTITUTIONAL: Alert and oriented and responds appropriately to questions. Chronically ill-appearing but in no distress HEAD: Normocephalic EYES: Conjunctivae clear, PERRL, conjunctival pallor ENT: normal nose; no rhinorrhea; moist mucous membranes; pharynx without lesions noted NECK: Supple, no meningismus, no LAD  CARD: RRR; S1 and S2 appreciated; no murmurs, no clicks, no rubs, no gallops RESP: Normal chest excursion without splinting or tachypnea; breath sounds clear and equal bilaterally; no wheezes, no rhonchi, no rales,  ABD/GI:  distended and slightly hard but no peritoneal signs, tenderness, guarding or rebound, hypoactive bowel sounds, no tympany or fluid wave BACK:  The back appears normal and is non-tender to palpation, there is no CVA tenderness EXT: Normal ROM in all joints; non-tender to palpation; no edema; normal capillary refill; no cyanosis    SKIN: Normal color for age and race; warm NEURO: Moves all extremities equally, sensation to light touch intact diffusely, cranial nerves II through XII intact PSYCH: The patient's mood and manner are appropriate. Grooming and personal hygiene are appropriate.  MEDICAL DECISION MAKING: Patient here with hypotension I suspect anemia given her conjunctival pallor and history of chronic kidney disease. We'll obtain labs. Will also obtain chest x-ray and urine to evaluate for possible signs of infection. We'll give IV fluids. She'll likely need admission.  ED PROGRESS: Patient's hemoglobin is 5.6. Will transfuse 2 units.  Her blood pressure is still slightly low. I feel she will need admission to step down for close monitoring given her hypotension. We'll discuss with hospitalist.    Patient's potassium is 6.8. Her EKG shows peaked T waves in anterior leads. Will give second liter IV fluids, calcium, insulin and D50, albuterol. She still makes urine. I do not  want to give her Lasix her Kayexalate at this time as she is still hypotensive with systolic blood pressures in the 90s. She still mentating normally. She states she is followed at [facility] but I am not able to find outside records for her. Is unclear what her creatinine is at baseline but states that her kidney function is "only functioning at 12%". Creatinine today is 11.54, BUN 163, bicarbonate 13. Patient will likely need dialysis in the does not have any current access. Will consult nephrology on call and admit.  Discussed with Dr. Servando Salina.  Patient will likely need dialysis today. We'll consult medicine for admission.     EKG Interpretation  Date/Time:  Friday August 27 2013 10:36:37 EDT Ventricular Rate:  72 PR Interval:  192 QRS Duration: 91 QT Interval:  423 QTC Calculation: 463 R Axis:   10 Text Interpretation:  Sinus rhythm Abnormal R-wave progression, early transition Nonspecific T abnormalities, lateral leads Confirmed by Tamirra Sienkiewicz,  DO, Malak Duchesneau ([id number]) on 08/21/2013 10:39:30 AM         CRITICAL CARE Performed by: Nyra Jabs   Total critical care time: 45 minutes  Critical care time was exclusive of separately billable procedures and treating other patients.  Critical care was necessary to treat or prevent imminent or life-threatening deterioration.  Critical care was time spent personally by me on the following activities: development of treatment plan with patient and/or surrogate as well as nursing, discussions with consultants, evaluation of patient's response to treatment, examination of patient, obtaining history from patient or surrogate, ordering and performing treatments and interventions, ordering and review of laboratory studies, ordering and review of radiographic studies, pulse oximetry and re-evaluation of patient's condition.   Carter, DO 08/31/2013 [phone]

## 2013-08-28 ENCOUNTER — Inpatient Hospital Stay (HOSPITAL_COMMUNITY): Payer: Medicare Other

## 2013-08-28 ENCOUNTER — Encounter (HOSPITAL_COMMUNITY): Admission: EM | Disposition: E | Payer: Self-pay | Source: Home / Self Care | Attending: Internal Medicine

## 2013-08-28 DIAGNOSIS — N185 Chronic kidney disease, stage 5: Secondary | ICD-10-CM

## 2013-08-28 DIAGNOSIS — I469 Cardiac arrest, cause unspecified: Secondary | ICD-10-CM

## 2013-08-28 DIAGNOSIS — J9600 Acute respiratory failure, unspecified whether with hypoxia or hypercapnia: Secondary | ICD-10-CM

## 2013-08-28 DIAGNOSIS — N179 Acute kidney failure, unspecified: Secondary | ICD-10-CM

## 2013-08-28 DIAGNOSIS — I9589 Other hypotension: Secondary | ICD-10-CM

## 2013-08-28 LAB — GLUCOSE, CAPILLARY
GLUCOSE-CAPILLARY: 115 mg/dL — AB (ref 70–99)
GLUCOSE-CAPILLARY: 89 mg/dL (ref 70–99)
GLUCOSE-CAPILLARY: 96 mg/dL (ref 70–99)
Glucose-Capillary: 164 mg/dL — ABNORMAL HIGH (ref 70–99)
Glucose-Capillary: 147 mg/dL — ABNORMAL HIGH (ref 70–99)
Glucose-Capillary: 135 mg/dL — ABNORMAL HIGH (ref 70–99)

## 2013-08-28 LAB — TROPONIN I
Troponin I: <0.3 ng/mL (ref <0.30)
Troponin I: <0.3 ng/mL (ref <0.30)

## 2013-08-28 LAB — POCT I-STAT 3, ART BLOOD GAS (G3+)
Acid-base deficit: 13 mmol/L — ABNORMAL HIGH (ref 0.0–2.0)
Bicarbonate: 15 mEq/L — ABNORMAL LOW (ref 20.0–24.0)
O2 Saturation: 100 %
PO2 ART: 317 mmHg — AB (ref 80.0–100.0)
TCO2: 16 mmol/L (ref 0–100)
pCO2 arterial: 43.3 mmHg (ref 35.0–45.0)
pH, Arterial: 7.148 — CL (ref 7.350–7.450)

## 2013-08-28 LAB — CBC
HCT: 26 % — ABNORMAL LOW (ref 36.0–46.0)
HCT: 22.9 % — ABNORMAL LOW (ref 36.0–46.0)
HEMATOCRIT: 22.7 % — AB (ref 36.0–46.0)
HEMOGLOBIN: 8.5 g/dL — AB (ref 12.0–15.0)
Hemoglobin: 7.6 g/dL — ABNORMAL LOW (ref 12.0–15.0)
Hemoglobin: 7.3 g/dL — ABNORMAL LOW (ref 12.0–15.0)
MCH: 24.6 pg — ABNORMAL LOW (ref 26.0–34.0)
MCH: 24.3 pg — AB (ref 26.0–34.0)
MCH: 25.3 pg — AB (ref 26.0–34.0)
MCHC: 32.7 g/dL (ref 30.0–36.0)
MCHC: 31.9 g/dL (ref 30.0–36.0)
MCHC: 33.5 g/dL (ref 30.0–36.0)
MCV: 75.1 fL — ABNORMAL LOW (ref 78.0–100.0)
MCV: 76.3 fL — AB (ref 78.0–100.0)
MCV: 75.7 fL — ABNORMAL LOW (ref 78.0–100.0)
PLATELETS: 177 10*3/uL (ref 150–400)
Platelets: 324 10*3/uL (ref 150–400)
Platelets: 198 10*3/uL (ref 150–400)
RBC: 3.46 MIL/uL — ABNORMAL LOW (ref 3.87–5.11)
RBC: 3 MIL/uL — ABNORMAL LOW (ref 3.87–5.11)
RBC: 3 MIL/uL — ABNORMAL LOW (ref 3.87–5.11)
RDW: 16.4 % — AB (ref 11.5–15.5)
RDW: 16.4 % — ABNORMAL HIGH (ref 11.5–15.5)
RDW: 16.7 % — AB (ref 11.5–15.5)
WBC: 11 10*3/uL — AB (ref 4.0–10.5)
WBC: 11.5 10*3/uL — AB (ref 4.0–10.5)
WBC: 7.6 10*3/uL (ref 4.0–10.5)

## 2013-08-28 LAB — IRON AND TIBC
Iron: 102 ug/dL (ref 42–135)
UIBC: <15 ug/dL — ABNORMAL LOW (ref 125–400)

## 2013-08-28 LAB — BASIC METABOLIC PANEL
ANION GAP: 33 — AB (ref 5–15)
ANION GAP: 29 — AB (ref 5–15)
ANION GAP: 30 — AB (ref 5–15)
BUN: 160 mg/dL — ABNORMAL HIGH (ref 6–23)
BUN: 165 mg/dL — ABNORMAL HIGH (ref 6–23)
BUN: 144 mg/dL — ABNORMAL HIGH (ref 6–23)
BUN: 131 mg/dL — ABNORMAL HIGH (ref 6–23)
CALCIUM: 7.1 mg/dL — AB (ref 8.4–10.5)
CHLORIDE: 99 meq/L (ref 96–112)
CHLORIDE: 90 meq/L — AB (ref 96–112)
CO2: 12 meq/L — AB (ref 19–32)
CO2: 12 meq/L — AB (ref 19–32)
CO2: 13 meq/L — AB (ref 19–32)
CO2: 14 mEq/L — ABNORMAL LOW (ref 19–32)
Calcium: 8.3 mg/dL — ABNORMAL LOW (ref 8.4–10.5)
Calcium: 9.3 mg/dL (ref 8.4–10.5)
Calcium: 7.6 mg/dL — ABNORMAL LOW (ref 8.4–10.5)
Chloride: 100 mEq/L (ref 96–112)
Chloride: 99 mEq/L (ref 96–112)
Creatinine, Ser: 10.71 mg/dL — ABNORMAL HIGH (ref 0.50–1.10)
Creatinine, Ser: 10.85 mg/dL — ABNORMAL HIGH (ref 0.50–1.10)
Creatinine, Ser: 9.72 mg/dL — ABNORMAL HIGH (ref 0.50–1.10)
Creatinine, Ser: 8.58 mg/dL — ABNORMAL HIGH (ref 0.50–1.10)
GFR calc Af Amer: 4 mL/min — ABNORMAL LOW (ref >90)
GFR calc Af Amer: 4 mL/min — ABNORMAL LOW (ref >90)
GFR calc Af Amer: 4 mL/min — ABNORMAL LOW (ref >90)
GFR calc Af Amer: 5 mL/min — ABNORMAL LOW (ref >90)
GFR calc non Af Amer: 3 mL/min — ABNORMAL LOW (ref >90)
GFR calc non Af Amer: 3 mL/min — ABNORMAL LOW (ref >90)
GFR calc non Af Amer: 4 mL/min — ABNORMAL LOW (ref >90)
GFR calc non Af Amer: 4 mL/min — ABNORMAL LOW (ref >90)
GLUCOSE: 248 mg/dL — AB (ref 70–99)
Glucose, Bld: 110 mg/dL — ABNORMAL HIGH (ref 70–99)
Glucose, Bld: 199 mg/dL — ABNORMAL HIGH (ref 70–99)
Glucose, Bld: 150 mg/dL — ABNORMAL HIGH (ref 70–99)
POTASSIUM: 6.5 meq/L — AB (ref 3.7–5.3)
Potassium: 5.9 mEq/L — ABNORMAL HIGH (ref 3.7–5.3)
Potassium: 5.8 mEq/L — ABNORMAL HIGH (ref 3.7–5.3)
Potassium: 6.3 mEq/L — ABNORMAL HIGH (ref 3.7–5.3)
SODIUM: 134 meq/L — AB (ref 137–147)
SODIUM: 141 meq/L (ref 137–147)
SODIUM: 144 meq/L (ref 137–147)
SODIUM: 144 meq/L (ref 137–147)

## 2013-08-28 LAB — BLOOD GAS, ARTERIAL
Acid-base deficit: 14.1 mmol/L — ABNORMAL HIGH (ref 0.0–2.0)
Bicarbonate: 12 mEq/L — ABNORMAL LOW (ref 20.0–24.0)
DRAWN BY: 283401
FIO2: 0.6 %
LHR: 20 {breaths}/min
MECHVT: 500 mL
O2 Saturation: 93.2 %
PEEP: 5 cmH2O
PO2 ART: 78.2 mmHg — AB (ref 80.0–100.0)
Patient temperature: 98
TCO2: 12.9 mmol/L (ref 0–100)
pCO2 arterial: 29.2 mmHg — ABNORMAL LOW (ref 35.0–45.0)
pH, Arterial: 7.234 — ABNORMAL LOW (ref 7.350–7.450)

## 2013-08-28 LAB — RENAL FUNCTION PANEL
ALBUMIN: 2 g/dL — AB (ref 3.5–5.2)
Albumin: 1.6 g/dL — ABNORMAL LOW (ref 3.5–5.2)
Anion gap: 25 — ABNORMAL HIGH (ref 5–15)
Anion gap: 31 — ABNORMAL HIGH (ref 5–15)
BUN: 107 mg/dL — ABNORMAL HIGH (ref 6–23)
BUN: 146 mg/dL — AB (ref 6–23)
CALCIUM: 6.6 mg/dL — AB (ref 8.4–10.5)
CHLORIDE: 97 meq/L (ref 96–112)
CO2: 16 meq/L — AB (ref 19–32)
CO2: 13 meq/L — AB (ref 19–32)
CREATININE: 9.44 mg/dL — AB (ref 0.50–1.10)
Calcium: 7.3 mg/dL — ABNORMAL LOW (ref 8.4–10.5)
Chloride: 109 mEq/L (ref 96–112)
Creatinine, Ser: 7.02 mg/dL — ABNORMAL HIGH (ref 0.50–1.10)
GFR calc Af Amer: 4 mL/min — ABNORMAL LOW (ref >90)
GFR calc non Af Amer: 4 mL/min — ABNORMAL LOW (ref >90)
GFR, EST AFRICAN AMERICAN: 6 mL/min — AB (ref >90)
GFR, EST NON AFRICAN AMERICAN: 5 mL/min — AB (ref >90)
GLUCOSE: 158 mg/dL — AB (ref 70–99)
Glucose, Bld: 76 mg/dL (ref 70–99)
Phosphorus: 7.4 mg/dL — ABNORMAL HIGH (ref 2.3–4.6)
Phosphorus: 10.3 mg/dL — ABNORMAL HIGH (ref 2.3–4.6)
Potassium: 4.4 mEq/L (ref 3.7–5.3)
Potassium: 5.7 mEq/L — ABNORMAL HIGH (ref 3.7–5.3)
SODIUM: 150 meq/L — AB (ref 137–147)
Sodium: 141 mEq/L (ref 137–147)

## 2013-08-28 LAB — MAGNESIUM: MAGNESIUM: 1.9 mg/dL (ref 1.5–2.5)

## 2013-08-28 LAB — HEMOGLOBIN A1C
HEMOGLOBIN A1C: 5.7 % — AB (ref <5.7)
Mean Plasma Glucose: 117 mg/dL — ABNORMAL HIGH (ref <117)

## 2013-08-28 LAB — LACTIC ACID, PLASMA
LACTIC ACID, VENOUS: 7.5 mmol/L — AB (ref 0.5–2.2)
Lactic Acid, Venous: 4.5 mmol/L — ABNORMAL HIGH (ref 0.5–2.2)
Lactic Acid, Venous: 5.3 mmol/L — ABNORMAL HIGH (ref 0.5–2.2)
Lactic Acid, Venous: 9 mmol/L — ABNORMAL HIGH (ref 0.5–2.2)

## 2013-08-28 LAB — CBC WITH DIFFERENTIAL/PLATELET
BASOS ABS: 0 10*3/uL (ref 0.0–0.1)
Basophils Relative: 0 % (ref 0–1)
Eosinophils Absolute: 0 10*3/uL (ref 0.0–0.7)
Eosinophils Relative: 0 % (ref 0–5)
HCT: 25.1 % — ABNORMAL LOW (ref 36.0–46.0)
Hemoglobin: 8.3 g/dL — ABNORMAL LOW (ref 12.0–15.0)
LYMPHS PCT: 22 % (ref 12–46)
Lymphs Abs: 2.1 10*3/uL (ref 0.7–4.0)
MCH: 24.9 pg — ABNORMAL LOW (ref 26.0–34.0)
MCHC: 33.1 g/dL (ref 30.0–36.0)
MCV: 75.4 fL — AB (ref 78.0–100.0)
Monocytes Absolute: 0.2 10*3/uL (ref 0.1–1.0)
Monocytes Relative: 2 % — ABNORMAL LOW (ref 3–12)
NEUTROS ABS: 7.3 10*3/uL (ref 1.7–7.7)
Neutrophils Relative %: 76 % (ref 43–77)
PLATELETS: 309 10*3/uL (ref 150–400)
RBC: 3.33 MIL/uL — ABNORMAL LOW (ref 3.87–5.11)
RDW: 16.3 % — ABNORMAL HIGH (ref 11.5–15.5)
WBC: 9.7 10*3/uL (ref 4.0–10.5)

## 2013-08-28 LAB — PROTIME-INR
INR: 1.56 — ABNORMAL HIGH (ref 0.00–1.49)
Prothrombin Time: 18.7 seconds — ABNORMAL HIGH (ref 11.6–15.2)

## 2013-08-28 LAB — POCT ACTIVATED CLOTTING TIME
Activated Clotting Time: 163 seconds
Activated Clotting Time: 197 seconds

## 2013-08-28 LAB — CORTISOL: Cortisol, Plasma: 185.5 ug/dL

## 2013-08-28 LAB — FERRITIN: Ferritin: 239 ng/mL (ref 10–291)

## 2013-08-28 LAB — PROCALCITONIN: Procalcitonin: 21.35 ng/mL

## 2013-08-28 SURGERY — INSERTION OF DIALYSIS CATHETER
Anesthesia: Monitor Anesthesia Care | Site: Neck

## 2013-08-28 MED ORDER — PRISMASOL BGK 4/2.5 32-4-2.5 MEQ/L IV SOLN
INTRAVENOUS | Status: DC
  Administered 2013-08-28: 04:00:00 via INTRAVENOUS_CENTRAL
  Filled 2013-08-28 (×5): qty 5000

## 2013-08-28 MED ORDER — VANCOMYCIN HCL IN DEXTROSE 750-5 MG/150ML-% IV SOLN
750.0000 mg | INTRAVENOUS | Status: DC
  Administered 2013-08-29: 750 mg via INTRAVENOUS
  Filled 2013-08-28 (×2): qty 150

## 2013-08-28 MED ORDER — DEXTROSE 50 % IV SOLN
1.0000 | Freq: Once | INTRAVENOUS | Status: AC
  Administered 2013-08-28: 50 mL via INTRAVENOUS

## 2013-08-28 MED ORDER — SODIUM CHLORIDE 0.9 % IV SOLN
1.0000 g | Freq: Once | INTRAVENOUS | Status: DC
  Filled 2013-08-28: qty 10

## 2013-08-28 MED ORDER — PRISMASOL BGK 0/2.5 32-2.5 MEQ/L IV SOLN
INTRAVENOUS | Status: DC
  Administered 2013-08-28: 23:00:00 via INTRAVENOUS_CENTRAL
  Filled 2013-08-28 (×4): qty 5000

## 2013-08-28 MED ORDER — SODIUM CHLORIDE 0.9 % IV BOLUS (SEPSIS)
1000.0000 mL | Freq: Once | INTRAVENOUS | Status: AC
  Administered 2013-08-28: 1000 mL via INTRAVENOUS

## 2013-08-28 MED ORDER — HYDROCORTISONE NA SUCCINATE PF 100 MG IJ SOLR
50.0000 mg | Freq: Four times a day (QID) | INTRAMUSCULAR | Status: DC
  Administered 2013-08-28 – 2013-08-29 (×5): 50 mg via INTRAVENOUS
  Filled 2013-08-28 (×8): qty 1

## 2013-08-28 MED ORDER — MORPHINE SULFATE 4 MG/ML IJ SOLN
INTRAMUSCULAR | Status: AC
  Administered 2013-08-28: 4 mg
  Filled 2013-08-28: qty 1

## 2013-08-28 MED ORDER — SODIUM CHLORIDE 0.9 % FOR CRRT
INTRAVENOUS_CENTRAL | Status: DC | PRN
  Filled 2013-08-28: qty 1000

## 2013-08-28 MED ORDER — SODIUM BICARBONATE 8.4 % IV SOLN
50.0000 meq | Freq: Once | INTRAVENOUS | Status: AC
  Administered 2013-08-28: 50 meq via INTRAVENOUS

## 2013-08-28 MED ORDER — FENTANYL CITRATE 0.05 MG/ML IJ SOLN: 50.0000 ug | INTRAMUSCULAR | Status: DC | PRN

## 2013-08-28 MED ORDER — PANTOPRAZOLE SODIUM 40 MG IV SOLR
40.0000 mg | INTRAVENOUS | Status: DC
  Administered 2013-08-28 – 2013-08-29 (×2): 40 mg via INTRAVENOUS
  Filled 2013-08-28 (×3): qty 40

## 2013-08-28 MED ORDER — SODIUM CHLORIDE 0.9 % IJ SOLN
250.0000 [IU]/h | INTRAMUSCULAR | Status: DC
  Administered 2013-08-28: 500 [IU]/h via INTRAVENOUS_CENTRAL
  Administered 2013-08-29: 600 [IU]/h via INTRAVENOUS_CENTRAL
  Filled 2013-08-28 (×2): qty 2

## 2013-08-28 MED ORDER — ALTEPLASE 100 MG IV SOLR
2.0000 mg | Freq: Once | INTRAVENOUS | Status: AC
  Administered 2013-08-28: 1.4 mg
  Filled 2013-08-28: qty 2

## 2013-08-28 MED ORDER — EPINEPHRINE HCL 1 MG/ML IJ SOLN
0.5000 ug/min | INTRAVENOUS | Status: DC
  Administered 2013-08-28 – 2013-08-29 (×7): 20 ug/min via INTRAVENOUS
  Filled 2013-08-28 (×10): qty 4

## 2013-08-28 MED ORDER — MORPHINE SULFATE 10 MG/ML IJ SOLN
1.0000 mg/h | INTRAMUSCULAR | Status: DC
  Administered 2013-08-28: 2 mg/h via INTRAVENOUS
  Administered 2013-08-29: 5 mg/h via INTRAVENOUS
  Administered 2013-08-29: 10 mg/h via INTRAVENOUS
  Filled 2013-08-28: qty 10

## 2013-08-28 MED ORDER — PHENYLEPHRINE HCL 10 MG/ML IJ SOLN
30.0000 ug/min | INTRAVENOUS | Status: DC
  Administered 2013-08-28 (×3): 200 ug/min via INTRAVENOUS
  Administered 2013-08-28: 50 ug/min via INTRAVENOUS
  Administered 2013-08-28 – 2013-08-29 (×6): 200 ug/min via INTRAVENOUS
  Filled 2013-08-28 (×10): qty 4

## 2013-08-28 MED ORDER — SODIUM CHLORIDE 0.9 % IV SOLN
1250.0000 mg | Freq: Once | INTRAVENOUS | Status: AC
  Administered 2013-08-28: 1250 mg via INTRAVENOUS
  Filled 2013-08-28: qty 1250

## 2013-08-28 MED ORDER — PRISMASOL BGK 4/2.5 32-4-2.5 MEQ/L IV SOLN
INTRAVENOUS | Status: DC
  Administered 2013-08-28: 04:00:00 via INTRAVENOUS_CENTRAL
  Filled 2013-08-28 (×9): qty 5000

## 2013-08-28 MED ORDER — INSULIN ASPART 100 UNIT/ML IV SOLN
10.0000 [IU] | Freq: Once | INTRAVENOUS | Status: AC
  Administered 2013-08-28: 10 [IU] via INTRAVENOUS

## 2013-08-28 MED ORDER — DEXTROSE 50 % IV SOLN
INTRAVENOUS | Status: AC
  Filled 2013-08-28: qty 50

## 2013-08-28 MED ORDER — CHLORHEXIDINE GLUCONATE 0.12 % MT SOLN
15.0000 mL | Freq: Two times a day (BID) | OROMUCOSAL | Status: DC
Start: 2013-08-28 — End: 2013-08-29
  Administered 2013-08-28 – 2013-08-29 (×3): 15 mL via OROMUCOSAL
  Filled 2013-08-28 (×2): qty 15

## 2013-08-28 MED ORDER — STERILE WATER FOR INJECTION IV SOLN
INTRAVENOUS | Status: DC
  Administered 2013-08-28 – 2013-08-29 (×13): via INTRAVENOUS_CENTRAL
  Filled 2013-08-28 (×39): qty 150

## 2013-08-28 MED ORDER — HYDROCORTISONE NA SUCCINATE PF 100 MG IJ SOLR
100.0000 mg | Freq: Three times a day (TID) | INTRAMUSCULAR | Status: DC
  Administered 2013-08-28: 100 mg via INTRAVENOUS
  Filled 2013-08-28 (×4): qty 2

## 2013-08-28 MED ORDER — HEPARIN SODIUM (PORCINE) 1000 UNIT/ML IJ SOLN
3000.0000 [IU] | Freq: Once | INTRAMUSCULAR | Status: AC
  Administered 2013-08-28: 2800 [IU]
  Filled 2013-08-28: qty 3

## 2013-08-28 MED ORDER — NOREPINEPHRINE BITARTRATE 1 MG/ML IV SOLN
2.0000 ug/min | INTRAVENOUS | Status: DC
  Administered 2013-08-28: 30 ug/min via INTRAVENOUS
  Administered 2013-08-28 – 2013-08-29 (×6): 50 ug/min via INTRAVENOUS
  Filled 2013-08-28 (×7): qty 16

## 2013-08-28 MED ORDER — MORPHINE SULFATE 4 MG/ML IJ SOLN
4.0000 mg | Freq: Once | INTRAMUSCULAR | Status: AC
  Administered 2013-08-28: 4 mg via INTRAVENOUS

## 2013-08-28 MED ORDER — HEPARIN BOLUS VIA INFUSION (CRRT)
1000.0000 [IU] | INTRAVENOUS | Status: DC | PRN
  Filled 2013-08-28: qty 1000

## 2013-08-28 MED ORDER — SODIUM BICARBONATE 8.4 % IV SOLN
INTRAVENOUS | Status: AC
  Administered 2013-08-28: 50 meq via INTRAVENOUS
  Filled 2013-08-28: qty 50

## 2013-08-28 MED ORDER — CETYLPYRIDINIUM CHLORIDE 0.05 % MT LIQD
7.0000 mL | Freq: Four times a day (QID) | OROMUCOSAL | Status: DC
  Administered 2013-08-28 – 2013-08-29 (×5): 7 mL via OROMUCOSAL

## 2013-08-28 MED ORDER — HEPARIN SODIUM (PORCINE) 1000 UNIT/ML DIALYSIS: 1000.0000 [IU] | INTRAMUSCULAR | Status: DC | PRN

## 2013-08-28 MED ORDER — PRISMASOL BGK 4/2.5 32-4-2.5 MEQ/L IV SOLN
INTRAVENOUS | Status: DC
  Filled 2013-08-28 (×5): qty 5000

## 2013-08-28 MED ORDER — CALCIUM CHLORIDE 10 % IV SOLN
1.0000 g | Freq: Once | INTRAVENOUS | Status: AC
  Administered 2013-08-28: 1 g via INTRAVENOUS

## 2013-08-28 MED ORDER — STERILE WATER FOR INJECTION IV SOLN
INTRAVENOUS | Status: DC
  Administered 2013-08-28 – 2013-08-29 (×7): via INTRAVENOUS
  Filled 2013-08-28 (×11): qty 850

## 2013-08-28 MED ORDER — PRISMASOL BGK 4/2.5 32-4-2.5 MEQ/L IV SOLN
INTRAVENOUS | Status: DC
  Administered 2013-08-28: 04:00:00 via INTRAVENOUS_CENTRAL
  Filled 2013-08-28 (×10): qty 5000

## 2013-08-28 MED ORDER — DEXTROSE 5 % IV SOLN
2.0000 g | Freq: Two times a day (BID) | INTRAVENOUS | Status: DC
  Administered 2013-08-28 – 2013-08-29 (×3): 2 g via INTRAVENOUS
  Filled 2013-08-28 (×4): qty 2

## 2013-08-28 MED ORDER — CALCIUM CHLORIDE 10 % IV SOLN: 1.0000 g | Freq: Once | INTRAVENOUS | Status: DC

## 2013-08-28 MED ORDER — PRISMASOL BGK 0/2.5 32-2.5 MEQ/L IV SOLN
INTRAVENOUS | Status: DC
  Administered 2013-08-28 – 2013-08-29 (×5): via INTRAVENOUS_CENTRAL
  Filled 2013-08-28 (×12): qty 5000

## 2013-08-28 MED ORDER — VASOPRESSIN 20 UNIT/ML IJ SOLN
0.0300 [IU]/min | INTRAVENOUS | Status: DC
  Administered 2013-08-28 – 2013-08-29 (×2): 0.03 [IU]/min via INTRAVENOUS
  Filled 2013-08-28 (×2): qty 2

## 2013-08-28 NOTE — Procedures (Signed)
Central Venous Catheter Insertion Procedure Note Wanda Ferguson [id number] 1946/01/02  Procedure: Insertion of Central Venous Catheter Indications: HD  Procedure Details Consent: Unable to obtain consent because of emergent medical necessity. Time Out: Verified patient identification, verified procedure, site/side was marked, verified correct patient position, special equipment/implants available, medications/allergies/relevent history reviewed, required imaging and test results available.  Performed  Maximum sterile technique was used including antiseptics, cap, gloves, gown, hand hygiene, mask and sheet. Skin prep: Chlorhexidine; local anesthetic administered  An antimicrobial bonded/coated trialysis catheter was placed in the left femoral vein due to multiple prior unsuccessful attempts, no other available access using the Seldinger technique.  Two attempts were made to place catheter in the LIJ which were unsuccessful 2/2 inability to pass the catheter through soft tissues.   Evaluation Blood flow good Complications: No apparent complications Patient did tolerate procedure well. Chest X-ray ordered to verify placement.  CXR: N/A.  Lowry Ram, Michelyn Scullin R. 08/31/2013, 3:42 AM  I used ultrasound to locate and access the vein/artery.

## 2013-08-28 NOTE — Progress Notes (Signed)
CRITICAL VALUE ALERT  Critical value received:  Positive Blood Cultures GPC in Clusters  Date of notification:  08/24/2013  Time of notification:  2059  Critical value read back:Yes.    Nurse who received alert:  Delphia Grates RN  MD notified (1st page):  Dr Lincoln Maxin  Time of first page:  2155  MD notified (2nd page):  Time of second page:  Responding MD:  Dr Lincoln Maxin  Time MD responded:  2155

## 2013-08-28 NOTE — Progress Notes (Signed)
Pt became unresponsive and pulseless at CT with wide complex rhythm.  + emesis, likely aspiration.  Intubated.  ROSC to NSR after 3min CPR including Epi, Dextrose, Insulin, Ca by report.    Pt requiring pressors for BP support.  Femoral TDC placed by CCM.  Will start CRRT.  Post code K is 5.9, will use all 4K bath for now. No heparin in system given recent chest compressions.

## 2013-08-28 NOTE — Procedures (Signed)
Central Venous Catheter Insertion Procedure Note Wanda Ferguson [id number] 1945-04-11  Procedure: Insertion of Central Venous Catheter Indications: Assessment of intravascular volume, Drug and/or fluid administration and Frequent blood sampling  Procedure Details Consent: Unable to obtain consent because of emergent medical necessity. Time Out: Verified patient identification, verified procedure, site/side was marked, verified correct patient position, special equipment/implants available, medications/allergies/relevent history reviewed, required imaging and test results available.  Performed  Maximum sterile technique was used including antiseptics, cap, gloves, gown, hand hygiene, mask and sheet. Skin prep: Chlorhexidine; local anesthetic administered A antimicrobial bonded/coated triple lumen catheter was placed in the left femoral vein due to multiple attempts, no other available access using the Seldinger technique.  Evaluation Blood flow good Complications: No apparent complications Patient did tolerate procedure well. Placement was difficult and required multi dilations 2/2 difficulty passing catheters.  Yeng Perz R. 08/28/2013, 6:16 AM  I used ultrasound to locate and access the vein/artery.

## 2013-08-28 NOTE — Consult Note (Signed)
Consult Note  Patient name: Wanda Ferguson MRN: [id number] DOB: 07-Jan-1946 Sex: female  Consulting Physician:  Renal  Reason for Consult:  Chief Complaint  Patient presents with  . Abdominal Pain  . Constipation  . Fatigue    HISTORY OF PRESENT ILLNESS: The patient was admitted with stage V renal failure.  She has a three-day history of fatigue and malaise with nausea and vomiting.  She reports foul-smelling urine.  She has not seen a nephrologist for over a year.  A year ago she had a renal function of 12%. She has a history of uterine cancer in 2013.  She is treated with insulin for diabetes  Past Medical History  Diagnosis Date  . Fibromyalgia   . Chronic knee pain   . Hypertension   . Renal disorder     stage 5 CKD  . Hypercholesteremia   . Diabetes mellitus without complication   . Cancer 2014    uterine    Past Surgical History  Procedure Laterality Date  . Abdominal hysterectomy    . Tubal ligation      History   Social History  . Marital Status: Widowed    Spouse Name: N/A    Number of Children: N/A  . Years of Education: N/A   Occupational History  . Not on file.   Social History Main Topics  . Smoking status: Never Smoker   . Smokeless tobacco: Not on file  . Alcohol Use: Yes     Comment: occassionally  . Drug Use: No  . Sexual Activity: Not on file   Other Topics Concern  . Not on file   Social History Narrative  . No narrative on file    No family history on file.  Allergies as of 08/07/2013 - Review Complete 08/19/2013  Allergen Reaction Noted  . Zoloft [sertraline hcl]  08/11/2013    No current facility-administered medications on file prior to encounter.   No current outpatient prescriptions on file prior to encounter.     REVIEW OF SYSTEMS: Constitutional: Positive for malaise/fatigue. Negative for fever and chills.  Eyes: Negative for blurred vision.  Respiratory: Positive for wheezing. Negative for cough and  shortness of breath.  Cardiovascular: Negative for chest pain.  Gastrointestinal: Positive for nausea, vomiting and constipation. Negative for diarrhea, blood in stool and melena.  Genitourinary: Positive for dysuria.  Skin: Positive for itching.  Neurological: Positive for dizziness (on standing) and weakness. Negative for focal weakness.  All other systems reviewed and are negative.   PHYSICAL EXAMINATION: General: The patient appears their stated age.  Vital signs are BP 90/54  Pulse 63  Temp(Src) 97.8 F (36.6 C) (Oral)  Resp 20  Wt 119 lb 7.8 oz (54.2 kg)  SpO2 100% Pulmonary: Respirations are non-labored HEENT:  No gross abnormalities Abdomen: Soft and non-tender  Musculoskeletal: There are no major deformities.   Neurologic: No focal weakness or paresthesias are detected, Skin: There are no ulcer or rashes noted. Psychiatric: The patient has normal affect. Cardiovascular: There is a regular rate and rhythm without significant murmur appreciated.   Assessment:  Renal failure Plan: I was asked to place a tunneled dialysis catheter which was scheduled for this morning.  Unfortunately the patient suffered cardiac arrest during CT scan last night.  A temporary dialysis catheter was placed, and the patient is currently getting CVVH D.  She remains intubated.  She is not on sedation and is unresponsive.  Her daughter  and niece are at the bedside.  I will be unavailable to convert her catheter to a tunneled dialysis catheter if the patient's condition improves.  Please contact me if that occurs.     Eldridge Abrahams, M.D. [facility] Office: [phone] Pager:  [phone]

## 2013-08-28 NOTE — Code Documentation (Signed)
  Patient Name: Wanda Ferguson   MRN: [id number]   Date of Birth/ Sex: 16-Mar-1945 , female      Admission Date: 08/18/2013  Attending Provider: [name], MD  Primary Diagnosis: Hypotension   Indication: Pt was in her usual state of health until this PM, when she was noted to be unresponsive. Code blue was subsequently called. At the time of arrival on scene, ACLS protocol was underway.   Technical Description:  - CPR performance duration:  6  minutes  - Was defibrillation or cardioversion used? No   - Was external pacer placed? No  - Was patient intubated pre/post CPR? Yes   Medications Administered: Y = Yes; Blank = No Amiodarone    Atropine    Calcium  y  Epinephrine  y  Lidocaine    Magnesium    Norepinephrine    Phenylephrine    Sodium bicarbonate  y  Vasopressin     Post CPR evaluation:  - Final Status - Was patient successfully resuscitated ? Yes - What is current rhythm? NSR - What is current hemodynamic status? stable  Miscellaneous Information:  - Labs sent, including: n/a  - Primary team notified?  Yes  - Family Notified? No  - Additional notes/ transfer status: Transported back to ICU from [facility]     Jacques Earthly, MD  08/22/2013, 3:02 AM

## 2013-08-28 NOTE — Progress Notes (Signed)
Primary RN will withdraw TPA from right femoral Bandana when patient will be ready for HD. [facility] IV Team

## 2013-08-28 NOTE — Progress Notes (Signed)
PULMONARY / CRITICAL CARE MEDICINE   Name: Wanda Ferguson MRN: [id number] DOB: 06-26-1945    ADMISSION DATE:  08/31/2013 CONSULTATION DATE:  8/21  REFERRING MD :  [facility]  INITIAL PRESENTATION:  82F with hx of DM, Htn and CKD (followed @ [facility]) admitted initially to [facility] service via ED with CC of weakness and fatigue and found to have worsening renal function, markedly elevated BUN and Cr, hyperkalemia(6.8), severe anemia (Hgb 5.6) and hypotension that improved with initiation of PRBCs. Due to constellation of problems, it was felt that she should be admitted to ICU. Therefore PCCM consult obtained  STUDIES:  8/21 Renal US:  8/22 CT chest/abd/pelvis>>> WIDESPREAD metastatic disease (hx uterine ca) including liver, lungs, omentum/peritoneum. Dominant mass is 12cm R retroperitoneal mass obstructing R ureter.  High grade small bowel obstruction, concern for small bowel ischemia/necrosis.  Severe R hydronephrosis, ascites, LLL debris/aspiration.    SIGNIFICANT EVENTS: 8/22 bradycardia/PEA arrest (4min) during CT, widespread mets, shock, max pressors   SUBJECTIVE:  Overnight events noted.  D/w dr Marval Regal.    VITAL SIGNS: Temp:  [97.4 F (36.3 C)-98.3 F (36.8 C)] 97.8 F (36.6 C) (08/22 0801) Pulse Rate:  [58-70] 63 (08/22 0700) Resp:  [9-31] 20 (08/22 0700) BP: (81-114)/(37-62) 90/54 mmHg (08/22 0500) SpO2:  [91 %-100 %] 100 % (08/22 0700) Arterial Line BP: (85-100)/(46-58) 85/46 mmHg (08/22 0700) FiO2 (%):  [60 %] 60 % (08/22 0214) Weight:  [119 lb 7.8 oz (54.2 kg)] 119 lb 7.8 oz (54.2 kg) (08/22 0500) HEMODYNAMICS:   VENTILATOR SETTINGS: Vent Mode:  [-] PRVC FiO2 (%):  [60 %] 60 % Set Rate:  [20 bmp] 20 bmp Vt Set:  [500 mL] 500 mL PEEP:  [5 cmH20] 5 cmH20 Plateau Pressure:  [26 cmH20] 26 cmH20 INTAKE / OUTPUT:  Intake/Output Summary (Last 24 hours) at 08/09/2013 0936 Last data filed at 08/10/2013 0700  Gross per 24 hour  Intake 1903.89 ml  Output    250 ml  Net 1653.89 ml     PHYSICAL EXAMINATION: General:  Critically ill, max support  Neuro: sedated on vent, does not follow commands HEENT:  NCAT, PERRL Cardiovascular: bradycardic Lungs: resps even non labored on vent, few scattered rhonchi  Abdomen: obese, soft, NT, NABS Ext: warm, trace symmetric edema  LABS:  CBC  Recent Labs Lab 08/14/2013 1147 08/09/2013 0047 08/14/2013 0241  WBC 9.3 11.5* 9.7  HGB 5.6* 8.5* 8.3*  HCT 18.0* 26.0* 25.1*  PLT 342 324 309   Coag's  Recent Labs Lab 08/21/2013 0419  INR 1.56*   BMET  Recent Labs Lab 09/03/2013 0047 08/13/2013 0241 08/21/2013 0420  NA 144 144 150*  K 6.5* 5.9* 4.4  CL 100 99 109  CO2 12* 12* 16*  BUN 165* 160* 107*  CREATININE 10.71* 10.85* 7.02*  GLUCOSE 110* 248* 76   Electrolytes  Recent Labs Lab 08/16/2013 2053 08/15/2013 0047 08/07/2013 0241 08/08/2013 0419 08/22/2013 0420  CALCIUM 8.4 8.3* 9.3  --  6.6*  MG  --   --   --  1.9  --   PHOS 10.8*  --   --   --  7.4*   Sepsis Markers  Recent Labs Lab 08/11/2013 1200 09/02/2013 0240 08/14/2013 0245  LATICACIDVEN 1.00  --  4.5*  PROCALCITON  --  21.35  --    ABG  Recent Labs Lab 08/21/2013 0439  PHART 7.234*  PCO2ART 29.2*  PO2ART 78.2*   Liver Enzymes  Recent Labs Lab 08/27/13 1147 08/28/13 0420  AST  14  --   ALT 6  --   ALKPHOS 90  --   BILITOT 0.6  --   ALBUMIN 2.3* 2.0*   Cardiac Enzymes  Recent Labs Lab 08/16/2013 2053 08/27/2013 0047 08/27/2013 0241  TROPONINI <0.30 <0.30 <0.30   Glucose  Recent Labs Lab 08/09/2013 1732 08/26/2013 1827 08/26/2013 2238 08/26/2013 2317 08/09/2013 0358 08/28/13 0728  GLUCAP 73 81 121* 115* 89 96     US Renal  08/14/2013   CLINICAL DATA:  Acute renal failure  EXAM: RENAL/URINARY TRACT ULTRASOUND COMPLETE  COMPARISON:  None.  FINDINGS: Right Kidney:  Length: 12.7 cm. Echogenicity is increased. There is renal cortical thinning. There is severe hydronephrosis and proximal ureterectasis. No calculus is seen. There is a cyst arising from  the upper pole of the right kidney measuring 0.9 x 0.8 x 0.9 cm. No other renal mass. No perinephric fluid.  Left Kidney:  Length: 8.8 cm. Echogenicity is increased. Renal cortical thickness is within normal limits. There is no pelvicaliectasis. There is no perinephric fluid. There is a cyst in the mid kidney measuring 1.1 x 0.9 x 1.0 cm. There is no sonographically demonstrable calculus or ureterectasis.  Bladder:  Appears normal for degree of bladder distention.  There is moderate generalized ascites.  IMPRESSION: Severe hydronephrosis and proximal ureterectasis on the right. No hydronephrosis on the left. Kidneys are both echogenic consistent with medical renal disease. There is renal cortical thinning on the right.  There is size discrepancy between left and right kidneys. This finding raises question of renal artery stenosis on the left. In this regard, question whether the patient is hypertensive.  Moderate generalized ascites is present.   Electronically Signed   By: Lowella Grip M.D.   On: 08/16/2013 18:09   Dg Abd Acute W/chest  08/15/2013   CLINICAL DATA:  Abdominal pain and constipation.  EXAM: ACUTE ABDOMEN SERIES (ABDOMEN 2 VIEW & CHEST 1 VIEW)  COMPARISON:  None.  FINDINGS: Lung volumes are markedly low with some basilar atelectasis. A 0.9 cm nodular opacity is seen in the left upper lobe. A second nodular opacity in the right mid lung measures 1.0 cm. Heart size appears enlarged. Right paratracheal soft tissue fullness is identified.  Two views of the abdomen show no free intraperitoneal air. There is gaseous distention of small bowel loops of the 4.6 cm. A large volume of stool is seen throughout the colon.  IMPRESSION: Bilateral nodular opacities in the chest and fullness of the right paratracheal stripe. CT chest with contrast is recommended for further evaluation.  Abnormal bowel gas pattern could be due to partial small bowel obstruction or ileus.  Large volume of stool throughout the  colon.   Electronically Signed   By: Inge Rise M.D.   On: 08/15/2013 12:58     ASSESSMENT / PLAN:  PULMONARY A: Acute respiratory failure - post arrest  Probable aspiration PNA  Widespread metastatic disease  P:   Vent support  F/u ABG  F/u CXR  See discussion below    CARDIOVASCULAR A:  Cardiac arrest  Hypotension/ shock P:  Monitor BP, rhythm Cycle cardiac markers Wean pressors as able    RENAL A:   Stage V CKD AKI - r/t obstructive etiology in setting widespread cancer Severe R hydronephrosis  Acute on chronic metabolic acidosis due to renal failure Hyperkalemia P:   Medical rx for hyperkalemia  Renal following  Cont CRRT for now  Monitor BMET closely Monitor I/Os Correct electrolytes as indicated  GASTROINTESTINAL A:   Suspect small bowel ischemia/ necrosis  P:   NPO  Medical support as above  Not surgical candidate at this time with significant hemodynamic instability   HEMATOLOGIC A:   Acute on chronic anemia without overt bleeding noted P:  DVT px: SQ heparin Monitor CBC intermittently Transfuse per usual ICU guidelines Hemoccult stools  INFECTIOUS A:  No overt infections identified Urine 8/21 >>  Blood 8/21 >>  P:   Monitor temp, WBC count Micro and abx as above  ENDOCRINE A:   DM2   P:  SSI   NEUROLOGIC A:   Adult FTT due to worsening renal failure P:   RASS goal: 0 Minimize sedation/opioids  TODAY'S SUMMARY:   WIDESPREAD metastatic disease (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  Brief cardiac arrest with subsequent shock/VDRF.  Grave prognosis - she will not survive this, is not a candidate for sgy.  Cont CRRT for now and cont discussions with family.  Need transition to comfort care but family still coming to grips with this.   I have personally obtained a history, examined the patient, evaluated laboratory and  imaging results, formulated the assessment and plan and placed orders.  CRITICAL CARE: The patient is critically ill with multiple organ systems failure and requires high complexity decision making for assessment and support, frequent evaluation and titration of therapies, application of advanced monitoring technologies and extensive interpretation of multiple databases. Critical Care Time devoted to patient care services described in this note is  60 minutes.   Nickolas Madrid, NP 08/09/2013  9:36 AM Pager: [phone] or [phone]  *Care during the described time interval was provided by me and/or other providers on the critical care team. I have reviewed this patient's available data, including medical history, events of note, physical examination and test results as part of my evaluation.  Baltazar Apo, MD, PhD 08/31/2013, 4:46 PM [facility] [phone] or if no answer [phone]

## 2013-08-28 NOTE — ED Provider Notes (Signed)
Called to CT scanner where patient had suddenly gone unresponsive during abd/pelvis ct scan and found to be in cardiac arrest.  Pt brief history given of hyperkalemia, chronic renal disease, due to start dialysis after HD catheter placed.  Pt had pulses when I arrived, was vomiting copious amounts of clear bilious emesis.  Pt was given amp of calcium, bicarb, epi.  Pt was intubated by me with 7.5 ETT on first pass with good view of the cords.  Pt lost pulses, CPR initiated.  Given two more rounds of epi, insulin, glucose.  Pt regained pulses, initial wide complex now narrowed.  Pt to be transported to the ICU.  INTUBATION Performed by: Kalman Drape  Required items: required blood products, implants, devices, and special equipment available Patient identity confirmed: provided demographic data and hospital-assigned identification number Time out: Immediately prior to procedure a "time out" was called to verify the correct patient, procedure, equipment, support staff and site/side marked as required.  Indications: cardiac arrest  Intubation method: Direct Laryngoscopy   Preoxygenation: BVM  Sedatives: none Paralytic: none  Tube Size: 7.5 cuffed  Post-procedure assessment: chest rise and ETCO2 monitor Breath sounds: equal and absent over the epigastrium Tube secured with: ETT holder   Patient tolerated the procedure well with no immediate complications.     Kalman Drape, MD 08/19/2013 [phone]

## 2013-08-28 NOTE — Progress Notes (Signed)
ANTIBIOTIC CONSULT NOTE - INITIAL  Pharmacy Consult for Vancomycin/Cefepime Indication: rule out sepsis  Allergies  Allergen Reactions  . Zoloft [Sertraline Hcl]     Nausea /vomiting    Patient Measurements: 81.7 kg, per RN  Vital Signs: Temp: 98 F (36.7 C) (08/22 0400) Temp src: Oral (08/22 0400) BP: 105/50 mmHg (08/22 0400) Pulse Rate: 64 (08/22 0315)  Labs:  Recent Labs  09/03/2013 1147 09/02/2013 2053 08/18/2013 0047 08/15/2013 0241  WBC 9.3  --  11.5* 9.7  HGB 5.6*  --  8.5* 8.3*  PLT 342  --  324 309  CREATININE 11.24* 10.91* 10.71* 10.85*    Medical History: Past Medical History  Diagnosis Date  . Fibromyalgia   . Chronic knee pain   . Hypertension   . Renal disorder     stage 5 CKD  . Hypercholesteremia   . Diabetes mellitus without complication   . Cancer 2014    uterine    Assessment: 68 y/o F s/p PEA arrest, hypotension, starting CRRT, broadening antibiotic coverage, other labs as above.   Goal of Therapy:  Vancomycin trough level 15-20 mcg/ml  Plan:  -Vancomycin 1250 mg IV x 1, then 750 mg IV q24h -Cefepime 2g IV q12h -Trend WBC, temp, renal function  -Drug levels as indicated -Doses/frequencies will need to be changed when CRRT is stopped   Narda Bonds 08/08/2013,4:43 AM

## 2013-08-28 NOTE — Code Documentation (Addendum)
Patient alert and oriented upon arrival to CT, following commands and was at baseline mentation, during CT, member cardiac arrested, CPR started and code called. See code sheet documentation. Member intubated by ED MD and was transported back to 2M06.

## 2013-08-28 NOTE — Progress Notes (Signed)
Called to code blue in [place]. On my arrival patient s/p 3 rounds CPR with PEA with subsequent ROSC. Per RNs patient seen to become bradycardic on monitor in ICU prior to arrest. Patient transported back to ICU where emergent HD catheter placed.  Labs and CT images reviewed. CT notable for large mass in abdomen likely responsible for the hydroneprhosis. Pulmonary nodules present as well and hypoattenuating nodules on liver concerning for mets.  Limited bedside echo reveals apparently normal LV function; RV poorly evaluated.   New Issues for Patient as follows:  1. PEA arrest: Most likely 2/2 hyperkalemia (K 5.9 noted in post-code labs most likely 2/2 meds given during code). Acidosis likely also contributed. Patient not cooled 2/2 likely active cancer and concern for recent bleeding.  - EKG  2. Shock: Presumed mixed 2/2 code. Requring levo, vaso. Volume down based on collapsibility of veins noted during HD catheter placement. - IVF - Check cortisol - Empiric Stress dose Steroids - Broaden Abx to Vanc/Cefepime  3. Respiratory failure 2/2 # 1: Lung protective ventilation VAP prevention Serial ABGs Sedation to achieve RASS of 0  4. Likely metastatic uterine cancer: Unable to evaluate in more detail now given severity of illness.  CRITICAL CARE: The patient is critically ill with multiple organ systems failure and requires high complexity decision making for assessment and support, frequent evaluation and titration of therapies, application of advanced monitoring technologies and extensive interpretation of multiple databases. Critical Care Time devoted to patient care services described in this note is 90 minutes.

## 2013-08-28 NOTE — Progress Notes (Signed)
Patient ID: Wanda Ferguson, female   DOB: 06-15-1945, 68 y.o.   MRN: [id number] S:Events of last 12 hours noted as well as US/CT scan findings.  Pt now on pressors and CVVHD however issues with fem HD cath O:BP 90/54  Pulse 63  Temp(Src) 97.8 F (36.6 C) (Oral)  Resp 20  Wt 54.2 kg (119 lb 7.8 oz)  SpO2 100%  Intake/Output Summary (Last 24 hours) at 09/06/2013 0838 Last data filed at 08/26/2013 0700  Gross per 24 hour  Intake 1903.89 ml  Output    250 ml  Net 1653.89 ml   Intake/Output: I/O last 3 completed shifts: In: 1903.9 [P.O.:30; I.V.:436.4; Blood:637.5; IV Piggyback:800] Out: 250 [Emesis/NG output:250]  Intake/Output this shift:    Weight change:  Gen:WD elderly AAF intubated/sedated CVS:no rub Resp:cta NWG:[id number] BS Ext:tr edema   Recent Labs Lab 09/05/2013 1147 08/12/2013 2053 08/14/2013 0047 08/21/2013 0241 08/11/2013 0420  NA 141 141 144 144 150*  K 6.8* 6.6* 6.5* 5.9* 4.4  CL 98 100 100 99 109  CO2 13* 11* 12* 12* 16*  GLUCOSE 144* 105* 110* 248* 76  BUN 163* 164* 165* 160* 107*  CREATININE 11.24* 10.91* 10.71* 10.85* 7.02*  ALBUMIN 2.3*  --   --   --  2.0*  CALCIUM 7.9* 8.4 8.3* 9.3 6.6*  PHOS  --  10.8*  --   --  7.4*  AST 14  --   --   --   --   ALT 6  --   --   --   --    Liver Function Tests:  Recent Labs Lab 08/31/2013 1147 08/18/2013 0420  AST 14  --   ALT 6  --   ALKPHOS 90  --   BILITOT 0.6  --   PROT 6.0  --   ALBUMIN 2.3* 2.0*   No results found for this basename: LIPASE, AMYLASE,  in the last 168 hours No results found for this basename: AMMONIA,  in the last 168 hours CBC:  Recent Labs Lab 08/10/2013 1147 08/17/2013 0047 08/08/2013 0241  WBC 9.3 11.5* 9.7  NEUTROABS 7.2  --  7.3  HGB 5.6* 8.5* 8.3*  HCT 18.0* 26.0* 25.1*  MCV 75.0* 75.1* 75.4*  PLT 342 324 309   Cardiac Enzymes:  Recent Labs Lab 08/18/2013 2053 08/31/2013 0047 09/03/2013 0241  TROPONINI <0.30 <0.30 <0.30   CBG:  Recent Labs Lab 08/11/2013 1827 09/03/2013 2238  08/10/2013 2317 08/11/2013 0358 08/27/2013 0728  GLUCAP 81 121* 115* 89 96    Iron Studies:  Recent Labs  08/24/2013 2053  IRON 102  TIBC NOT CALC  FERRITIN 239   Studies/Results: Ct Abdomen Pelvis Wo Contrast  08/31/2013   CLINICAL DATA:  Right-sided hydronephrosis. Past history of uterine cancer.  EXAM: CT CHEST, ABDOMEN AND PELVIS WITHOUT CONTRAST  TECHNIQUE: Multidetector CT imaging of the chest, abdomen and pelvis was performed following the standard protocol without IV contrast.  COMPARISON:  Renal ultrasound 08/08/2013 ; chest x-ray 08/17/2013  FINDINGS: CT CHEST FINDINGS  Mediastinum: Mediastinal adenopathy. An index prevascular node measures 1.2 cm in short axis (image 21 series 21). An index left hilar node measures 1.5 cm in short axis (image 23 series 201) index low right paratracheal node measures 1.6 cm in short axis (image 18 series 201). Moderate hiatal hernia. The entire thoracic esophagus is fluid-filled to the level of the thyroid cartilage. One point 6 cm hypo attenuating lesion in the left thyroid gland is nonspecific by CT but  likely represents a thyroid nodule.  Heart/Vascular: Atherosclerotic calcifications in the transverse aorta. No aortic dilatation. Calcifications are present throughout the visualized coronary arteries. The intracardiac blood pool is hypodense relative to the adjacent myocardium consistent with anemia. The main pulmonary artery is enlarged at 3.5 cm in diameter suggesting pulmonary arterial hypertension. Marked elevation of left hemidiaphragm results in some a local mass effect and compression of the left ventricle.  Lungs/Pleura: Small bilateral layering pleural effusions. Respiratory motion limits evaluation for small pulmonary nodules. However, there are multiple (more than 10) bilateral pulmonary nodules concerning for metastatic disease. Index nodules include a 6 mm nodule in the anterior left upper lobe (image 13 series 202) and a 6 mm nodule in the  periphery of the right upper lobe (image 20 series 202). Marked elevation of the left hemidiaphragm with associated atelectasis of the lingula and left lower lobe. Debris present within the left lower lobar bronchus and its subtending segmental bronchi likely contributing to the left lower lobe atelectasis and consolidation.  Bones/Soft Tissues: Abnormal 1.2 cm lymph node in the right axilla concerning for metastatic disease. No acute fracture or aggressive appearing lytic or blastic osseous lesion.  CT ABDOMEN AND PELVIS FINDINGS  Abdomen/Pelvis: The stomach is distended with fluid. The proximal duodenum is unremarkable. Unremarkable appearance of the spleen, adrenal glands. Fatty atrophy of the pancreatic body and tail. Abnormal appearance of the liver. There are multiple (greater than 10) hypo dense lesions throughout the liver concerning for widespread metastatic disease. Index lesions measures 3.2 cm on image 51 of series 201 and 2.7 cm on image 56 of series 201. Branching gas within the anterior aspect of the liver consistent with portal venous gas.  Multiple loops of dilated small bowel throughout the abdomen. In the left mid abdomen there is a loop of small bowel with extensive pneumatosis. There are a few small locular of adjacent air which may be extraluminal although this is not definitive. It is difficult to assess for transition points given the ascites, mesenteric edema and absence of both intravenous and oral contrast material.  The gallbladder is distended and measures approximately 5 mm in diameter. No definite cholelithiasis. Moderate volume ascites throughout the abdomen. Severe right-sided hydronephrosis. Of note, the right kidney is relatively atrophic and this is suspected to be a longstanding process. 8 mm hypo attenuating lesion in the interpolar right kidney is incompletely characterized without intravenous contrast. Numerous hypo attenuating lesions throughout the left kidney are  incompletely characterized in the absence of intravenous contrast material.  Large right retroperitoneal mass measures 8.4 x 10.5 by 11.9 cm. This is the underlying cause of the patient's right-sided hydronephrosis. Additional metastatic right periaortic retroperitoneal adenopathy. Soft tissue density in the omentum in the low anterior abdomen concerning for omental caking. There is an omental fat and fluid containing paraumbilical hernia. Left-sided periaortic adenopathy also noted. Nonspecific circumferential thickening of the rectal wall concerning for proctitis. There is nonspecific presacral edema. Surgical changes of prior hysterectomy. The bladder is decompressed and thick walled.  Bones/Soft Tissues: Extensive anasarca. Nonspecific small sclerotic focus in the left ischial tuberosity. Nonspecific small sclerotic focus in the right sacral ala. No acute fracture or malalignment. Prominent Schmorl's node in the superior endplate of L3. T5 hemangioma. The bones are diffusely osteopenic.  Vascular: Limited evaluation in the absence of intravenous contrast. Scattered atherosclerotic vascular calcifications.  IMPRESSION: 1. High-grade small bowel obstruction with pneumatosis and portal venous gas highly concerning for small bowel ischemia and necrosis. Transition point is difficult  to evaluate in the absence of both intravenous and oral contrast and a moderate ascites and likely peritoneal metastatic disease. 2. Widespread metastatic disease including lungs, liver, omentum/peritoneum, and multiple nodal stations (retroperitoneal, mediastinal and right axillary). The dominant mass is a 12 cm right retroperitoneal mass/nodal conglomerate which obstructs the right ureter. Given the patient's past history of uterine cancer and the dominant mass in the right retroperitoneum, metastatic uterine cancer is suspected. There are a few nonspecific sclerotic foci in the left ischium and right sacral ala which could conceivably  represent additional foci of metastatic disease. 3. Severe right hydronephrosis with marked cortical thinning. Cortical thinning suggests that the hydronephrosis is longstanding. 4. Moderate volume peritoneal ascites may be a combination of a malignant ascites and inflammatory fluid related to the patient's suspected small bowel ischemic necrosis. 5. Debris within the left lower lobe pulmonary bronchus concerning for aspiration. There is associated left lower lobe atelectasis. 6. The entire thoracic esophagus is dilated and fluid-filled concerning for reflux an concerning for continued aspiration risk. Consider placement of nasogastric or orogastric tube. 7. The gallbladder is distended nearly 5 cm. No definite cholelithiasis. 8. Anasarca, small bilateral pleural effusions. 9. Enlarged main pulmonary artery as can be seen with pulmonary arterial hypertension. 10. Atherosclerosis including multifocal coronary artery disease. 11. Additional ancillary findings as above.  Critical Value/emergent results were called by telephone at the time of interpretation on 08/12/2013 at 7:40 am to the 13M charge nurse,Teresa Crite, who verbally acknowledged these results and will convey to the incoming Critical Care physician.   Electronically Signed   By: Jacqulynn Cadet M.D.   On: 08/18/2013 08:00   Ct Chest Wo Contrast  08/23/2013   CLINICAL DATA:  Right-sided hydronephrosis. Past history of uterine cancer.  EXAM: CT CHEST, ABDOMEN AND PELVIS WITHOUT CONTRAST  TECHNIQUE: Multidetector CT imaging of the chest, abdomen and pelvis was performed following the standard protocol without IV contrast.  COMPARISON:  Renal ultrasound 08/20/2013 ; chest x-ray 09/01/2013  FINDINGS: CT CHEST FINDINGS  Mediastinum: Mediastinal adenopathy. An index prevascular node measures 1.2 cm in short axis (image 21 series 21). An index left hilar node measures 1.5 cm in short axis (image 23 series 201) index low right paratracheal node measures 1.6 cm  in short axis (image 18 series 201). Moderate hiatal hernia. The entire thoracic esophagus is fluid-filled to the level of the thyroid cartilage. One point 6 cm hypo attenuating lesion in the left thyroid gland is nonspecific by CT but likely represents a thyroid nodule.  Heart/Vascular: Atherosclerotic calcifications in the transverse aorta. No aortic dilatation. Calcifications are present throughout the visualized coronary arteries. The intracardiac blood pool is hypodense relative to the adjacent myocardium consistent with anemia. The main pulmonary artery is enlarged at 3.5 cm in diameter suggesting pulmonary arterial hypertension. Marked elevation of left hemidiaphragm results in some a local mass effect and compression of the left ventricle.  Lungs/Pleura: Small bilateral layering pleural effusions. Respiratory motion limits evaluation for small pulmonary nodules. However, there are multiple (more than 10) bilateral pulmonary nodules concerning for metastatic disease. Index nodules include a 6 mm nodule in the anterior left upper lobe (image 13 series 202) and a 6 mm nodule in the periphery of the right upper lobe (image 20 series 202). Marked elevation of the left hemidiaphragm with associated atelectasis of the lingula and left lower lobe. Debris present within the left lower lobar bronchus and its subtending segmental bronchi likely contributing to the left lower lobe atelectasis and consolidation.  Bones/Soft Tissues: Abnormal 1.2 cm lymph node in the right axilla concerning for metastatic disease. No acute fracture or aggressive appearing lytic or blastic osseous lesion.  CT ABDOMEN AND PELVIS FINDINGS  Abdomen/Pelvis: The stomach is distended with fluid. The proximal duodenum is unremarkable. Unremarkable appearance of the spleen, adrenal glands. Fatty atrophy of the pancreatic body and tail. Abnormal appearance of the liver. There are multiple (greater than 10) hypo dense lesions throughout the liver  concerning for widespread metastatic disease. Index lesions measures 3.2 cm on image 51 of series 201 and 2.7 cm on image 56 of series 201. Branching gas within the anterior aspect of the liver consistent with portal venous gas.  Multiple loops of dilated small bowel throughout the abdomen. In the left mid abdomen there is a loop of small bowel with extensive pneumatosis. There are a few small locular of adjacent air which may be extraluminal although this is not definitive. It is difficult to assess for transition points given the ascites, mesenteric edema and absence of both intravenous and oral contrast material.  The gallbladder is distended and measures approximately 5 mm in diameter. No definite cholelithiasis. Moderate volume ascites throughout the abdomen. Severe right-sided hydronephrosis. Of note, the right kidney is relatively atrophic and this is suspected to be a longstanding process. 8 mm hypo attenuating lesion in the interpolar right kidney is incompletely characterized without intravenous contrast. Numerous hypo attenuating lesions throughout the left kidney are incompletely characterized in the absence of intravenous contrast material.  Large right retroperitoneal mass measures 8.4 x 10.5 by 11.9 cm. This is the underlying cause of the patient's right-sided hydronephrosis. Additional metastatic right periaortic retroperitoneal adenopathy. Soft tissue density in the omentum in the low anterior abdomen concerning for omental caking. There is an omental fat and fluid containing paraumbilical hernia. Left-sided periaortic adenopathy also noted. Nonspecific circumferential thickening of the rectal wall concerning for proctitis. There is nonspecific presacral edema. Surgical changes of prior hysterectomy. The bladder is decompressed and thick walled.  Bones/Soft Tissues: Extensive anasarca. Nonspecific small sclerotic focus in the left ischial tuberosity. Nonspecific small sclerotic focus in the right  sacral ala. No acute fracture or malalignment. Prominent Schmorl's node in the superior endplate of L3. T5 hemangioma. The bones are diffusely osteopenic.  Vascular: Limited evaluation in the absence of intravenous contrast. Scattered atherosclerotic vascular calcifications.  IMPRESSION: 1. High-grade small bowel obstruction with pneumatosis and portal venous gas highly concerning for small bowel ischemia and necrosis. Transition point is difficult to evaluate in the absence of both intravenous and oral contrast and a moderate ascites and likely peritoneal metastatic disease. 2. Widespread metastatic disease including lungs, liver, omentum/peritoneum, and multiple nodal stations (retroperitoneal, mediastinal and right axillary). The dominant mass is a 12 cm right retroperitoneal mass/nodal conglomerate which obstructs the right ureter. Given the patient's past history of uterine cancer and the dominant mass in the right retroperitoneum, metastatic uterine cancer is suspected. There are a few nonspecific sclerotic foci in the left ischium and right sacral ala which could conceivably represent additional foci of metastatic disease. 3. Severe right hydronephrosis with marked cortical thinning. Cortical thinning suggests that the hydronephrosis is longstanding. 4. Moderate volume peritoneal ascites may be a combination of a malignant ascites and inflammatory fluid related to the patient's suspected small bowel ischemic necrosis. 5. Debris within the left lower lobe pulmonary bronchus concerning for aspiration. There is associated left lower lobe atelectasis. 6. The entire thoracic esophagus is dilated and fluid-filled concerning for reflux an concerning for continued  aspiration risk. Consider placement of nasogastric or orogastric tube. 7. The gallbladder is distended nearly 5 cm. No definite cholelithiasis. 8. Anasarca, small bilateral pleural effusions. 9. Enlarged main pulmonary artery as can be seen with pulmonary  arterial hypertension. 10. Atherosclerosis including multifocal coronary artery disease. 11. Additional ancillary findings as above.  Critical Value/emergent results were called by telephone at the time of interpretation on 08/25/2013 at 7:40 am to the 12M charge nurse,Teresa Crite, who verbally acknowledged these results and will convey to the incoming Critical Care physician.   Electronically Signed   By: Jacqulynn Cadet M.D.   On: 08/21/2013 08:00   US Renal  08/20/2013   CLINICAL DATA:  Acute renal failure  EXAM: RENAL/URINARY TRACT ULTRASOUND COMPLETE  COMPARISON:  None.  FINDINGS: Right Kidney:  Length: 12.7 cm. Echogenicity is increased. There is renal cortical thinning. There is severe hydronephrosis and proximal ureterectasis. No calculus is seen. There is a cyst arising from the upper pole of the right kidney measuring 0.9 x 0.8 x 0.9 cm. No other renal mass. No perinephric fluid.  Left Kidney:  Length: 8.8 cm. Echogenicity is increased. Renal cortical thickness is within normal limits. There is no pelvicaliectasis. There is no perinephric fluid. There is a cyst in the mid kidney measuring 1.1 x 0.9 x 1.0 cm. There is no sonographically demonstrable calculus or ureterectasis.  Bladder:  Appears normal for degree of bladder distention.  There is moderate generalized ascites.  IMPRESSION: Severe hydronephrosis and proximal ureterectasis on the right. No hydronephrosis on the left. Kidneys are both echogenic consistent with medical renal disease. There is renal cortical thinning on the right.  There is size discrepancy between left and right kidneys. This finding raises question of renal artery stenosis on the left. In this regard, question whether the patient is hypertensive.  Moderate generalized ascites is present.   Electronically Signed   By: Lowella Grip M.D.   On: 08/31/2013 18:09   Dg Chest Port 1 View  09/03/2013   CLINICAL DATA:  Code.  Emergent intubation  EXAM: PORTABLE CHEST - 1 VIEW   COMPARISON:  08/28/2013  FINDINGS: Endotracheal tube ends at the clavicular heads. Orogastric tube enters the proximal stomach, with side port near the GE junction.  Cardiomegaly. Negative upper mediastinal contours. Extensive coronary atherosclerotic calcification.  There is elevation of the left diaphragm of uncertain chronicity. Bilateral perihilar airspace opacities. No pneumothorax.  IMPRESSION: 1. Endotracheal tube in good position. 2. Orogastric tube tip at the proximal stomach. Advancement by 5 cm would provide more secure positioning. 3. Low lung volumes with perihilar opacity, favor pulmonary edema. Aspiration could have a similar appearance given history of coding.   Electronically Signed   By: Jorje Guild M.D.   On: 08/28/2013 07:03   Dg Abd Acute W/chest  08/08/2013   CLINICAL DATA:  Abdominal pain and constipation.  EXAM: ACUTE ABDOMEN SERIES (ABDOMEN 2 VIEW & CHEST 1 VIEW)  COMPARISON:  None.  FINDINGS: Lung volumes are markedly low with some basilar atelectasis. A 0.9 cm nodular opacity is seen in the left upper lobe. A second nodular opacity in the right mid lung measures 1.0 cm. Heart size appears enlarged. Right paratracheal soft tissue fullness is identified.  Two views of the abdomen show no free intraperitoneal air. There is gaseous distention of small bowel loops of the 4.6 cm. A large volume of stool is seen throughout the colon.  IMPRESSION: Bilateral nodular opacities in the chest and fullness of the right paratracheal  stripe. CT chest with contrast is recommended for further evaluation.  Abnormal bowel gas pattern could be due to partial small bowel obstruction or ileus.  Large volume of stool throughout the colon.   Electronically Signed   By: Inge Rise M.D.   On: 08/16/2013 12:58   . alteplase  2 mg Intracatheter Once  . alteplase  2 mg Intracatheter Once  . aspirin EC  81 mg Oral QHS  . ceFEPime (MAXIPIME) IV  2 g Intravenous Q12H  . Chlorhexidine Gluconate Cloth  6  each Topical Q0600  . heparin  5,000 Units Subcutaneous 3 times per day  . hydrocortisone sod succinate (SOLU-CORTEF) inj  100 mg Intravenous Q8H  . insulin aspart  0-9 Units Subcutaneous TID WC  . mupirocin ointment  1 application Nasal BID  . pantoprazole (PROTONIX) IV  40 mg Intravenous Q24H  . sodium bicarbonate  25 mEq Intravenous Once  . sodium chloride  3 mL Intravenous Q12H  . [START ON 03-Sep-2013] vancomycin  750 mg Intravenous Q24H    BMET    Component Value Date/Time   NA 150* 08/17/2013 0420   K 4.4 09/05/2013 0420   CL 109 08/22/2013 0420   CO2 16* 09/03/2013 0420   GLUCOSE 76 08/07/2013 0420   BUN 107* 08/25/2013 0420   CREATININE 7.02* 08/20/2013 0420   CALCIUM 6.6* 08/27/2013 0420   GFRNONAA 5* 08/07/2013 0420   GFRAA 6* 08/31/2013 0420   CBC    Component Value Date/Time   WBC 9.7 08/27/2013 0241   RBC 3.33* 08/13/2013 0241   HGB 8.3* 09/02/2013 0241   HCT 25.1* 08/10/2013 0241   PLT 309 08/18/2013 0241   MCV 75.4* 08/15/2013 0241   MCH 24.9* 08/12/2013 0241   MCHC 33.1 08/21/2013 0241   RDW 16.3* 08/27/2013 0241   LYMPHSABS 2.1 08/19/2013 0241   MONOABS 0.2 08/19/2013 0241   EOSABS 0.0 08/28/2013 0241   BASOSABS 0.0 08/28/2013 0241     Assessment/Plan:  1. SIRS/Cardiac arrest- s/p CPR, intubation and inititation of pressors.  2. H/o uterine cancer without chemo- now appears to be metastatic 3. High grade SBO with pneumatosis/suspicion of necrosis- given overall clinical scenario and poor prognosis, would recommend CMO and not involve surgery as it is unlikely she would survive the operation, moreover it would not be curative. 4. AKI/CKD-  Secondary to obstruction from recurrent mass, (likely recurrent uterine cancer).  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  Currently CVVHD on hold due to cath malfunction.  Will activase and resume if family wishes to pursue further supportive  care 5. Hyperkalemia- resolved 6. Metabolic acidosis- multifactorial: AKI/CKD, SBO and likely ischemic bowel/necrosis.  Improved 7. VDRF- per PCCM 8. Anemia- likely secondary to malignancy and CKD.  S/p blood transfusion 9. Dispo- very grim prognosis and do not feel that this will be survivable (widely metastatic malignancy with obstruction of right kidney, omental caking, SBO causing ischemia/necrosis, ongoing hypotension).  Recommend palliative care/hospice for CMO.  Discussed case with Dr. Lamonte Sakai of PCCM who is in agreement.  I spoke with the family who verbalized understanding but has not decided about terminal wean vs ongoing current supportive care until family can get here.  However I did tell them she may have another cardiac arrest given ongoing need of pressors/bowel ischemia/necrosis.    [place] A

## 2013-08-28 NOTE — Progress Notes (Signed)
eLink Physician-Brief Progress Note Patient Name: Wanda Ferguson DOB: [birth year] MRN: [id number]   Date of Service  08/28/2013  HPI/Events of Note  hyperkalemia  eICU Interventions  Insulin, glucose, bicarb, bedside nurse will contact renal to change to lower/no K dialysate.     Intervention Category Major Interventions: Electrolyte abnormality - evaluation and management  [name] K. 08/28/2013, 10:13 PM

## 2013-08-28 NOTE — Procedures (Signed)
Arterial Catheter Insertion Procedure Note Wanda Ferguson [id number] Jul 25, 1945  Procedure: Insertion of Arterial Catheter  Indications: Blood pressure monitoring  Procedure Details Consent: Unable to obtain consent because of emergent medical necessity. Time Out: Verified patient identification, verified procedure, site/side was marked, verified correct patient position, special equipment/implants available, medications/allergies/relevent history reviewed, required imaging and test results available.  Performed  Maximum sterile technique was used including antiseptics, gloves, gown, hand hygiene and sheet. Skin prep: Chlorhexidine; local anesthetic administered 20 gauge catheter was inserted into right radial artery using the Seldinger technique.  Evaluation Blood flow good; BP tracing good. Complications: No apparent complications.   Mindi Slicker 08/16/2013

## 2013-08-28 NOTE — Progress Notes (Signed)
Patient ID: Wanda Ferguson, female   DOB: 19-Apr-1945, 68 y.o.   MRN: [id number]   Request for R percutaneous nephrostomy in IR  Pt critically ill Family at bedside  Poor clinical status ON HOLD per Dr Servando Salina

## 2013-08-29 LAB — BASIC METABOLIC PANEL
Anion gap: 32 — ABNORMAL HIGH (ref 5–15)
Anion gap: 37 — ABNORMAL HIGH (ref 5–15)
BUN: 119 mg/dL — ABNORMAL HIGH (ref 6–23)
BUN: 100 mg/dL — AB (ref 6–23)
CALCIUM: 6.7 mg/dL — AB (ref 8.4–10.5)
CHLORIDE: 77 meq/L — AB (ref 96–112)
CO2: 16 mEq/L — ABNORMAL LOW (ref 19–32)
CO2: 16 meq/L — AB (ref 19–32)
Calcium: 6.2 mg/dL — CL (ref 8.4–10.5)
Chloride: 87 mEq/L — ABNORMAL LOW (ref 96–112)
Creatinine, Ser: 7.61 mg/dL — ABNORMAL HIGH (ref 0.50–1.10)
Creatinine, Ser: 6.38 mg/dL — ABNORMAL HIGH (ref 0.50–1.10)
GFR calc Af Amer: 6 mL/min — ABNORMAL LOW (ref >90)
GFR calc Af Amer: 7 mL/min — ABNORMAL LOW (ref >90)
GFR calc non Af Amer: 5 mL/min — ABNORMAL LOW (ref >90)
GFR calc non Af Amer: 6 mL/min — ABNORMAL LOW (ref >90)
GLUCOSE: 163 mg/dL — AB (ref 70–99)
Glucose, Bld: 147 mg/dL — ABNORMAL HIGH (ref 70–99)
POTASSIUM: 5 meq/L (ref 3.7–5.3)
Potassium: 5.8 mEq/L — ABNORMAL HIGH (ref 3.7–5.3)
SODIUM: 135 meq/L — AB (ref 137–147)
Sodium: 130 mEq/L — ABNORMAL LOW (ref 137–147)

## 2013-08-29 LAB — CBC
HCT: 19.6 % — ABNORMAL LOW (ref 36.0–46.0)
HCT: 25 % — ABNORMAL LOW (ref 36.0–46.0)
Hemoglobin: 6.4 g/dL — CL (ref 12.0–15.0)
Hemoglobin: 8.1 g/dL — ABNORMAL LOW (ref 12.0–15.0)
MCH: 24.8 pg — ABNORMAL LOW (ref 26.0–34.0)
MCH: 25 pg — ABNORMAL LOW (ref 26.0–34.0)
MCHC: 32.7 g/dL (ref 30.0–36.0)
MCHC: 32.4 g/dL (ref 30.0–36.0)
MCV: 76 fL — ABNORMAL LOW (ref 78.0–100.0)
MCV: 77.2 fL — AB (ref 78.0–100.0)
PLATELETS: 106 10*3/uL — AB (ref 150–400)
PLATELETS: 82 10*3/uL — AB (ref 150–400)
RBC: 2.58 MIL/uL — ABNORMAL LOW (ref 3.87–5.11)
RBC: 3.24 MIL/uL — AB (ref 3.87–5.11)
RDW: 16.6 % — AB (ref 11.5–15.5)
RDW: 17.1 % — ABNORMAL HIGH (ref 11.5–15.5)
WBC: 12.7 10*3/uL — ABNORMAL HIGH (ref 4.0–10.5)
WBC: 16.4 10*3/uL — ABNORMAL HIGH (ref 4.0–10.5)

## 2013-08-29 LAB — GLUCOSE, CAPILLARY
GLUCOSE-CAPILLARY: 170 mg/dL — AB (ref 70–99)
Glucose-Capillary: 125 mg/dL — ABNORMAL HIGH (ref 70–99)
Glucose-Capillary: 139 mg/dL — ABNORMAL HIGH (ref 70–99)
Glucose-Capillary: 159 mg/dL — ABNORMAL HIGH (ref 70–99)

## 2013-08-29 LAB — PHOSPHORUS: Phosphorus: 10.8 mg/dL — ABNORMAL HIGH (ref 2.3–4.6)

## 2013-08-29 LAB — CULTURE, BLOOD (ROUTINE X 2)

## 2013-08-29 LAB — PREPARE RBC (CROSSMATCH)

## 2013-08-29 LAB — APTT: aPTT: 61 seconds — ABNORMAL HIGH (ref 24–37)

## 2013-08-29 LAB — POCT ACTIVATED CLOTTING TIME
ACTIVATED CLOTTING TIME: 197 s
Activated Clotting Time: 174 seconds
Activated Clotting Time: 197 seconds
Activated Clotting Time: 202 seconds
Activated Clotting Time: 247 seconds

## 2013-08-29 LAB — LACTIC ACID, PLASMA
Lactic Acid, Venous: 11.9 mmol/L — ABNORMAL HIGH (ref 0.5–2.2)
Lactic Acid, Venous: 17 mmol/L — ABNORMAL HIGH (ref 0.5–2.2)

## 2013-08-29 LAB — MAGNESIUM: MAGNESIUM: 2.1 mg/dL (ref 1.5–2.5)

## 2013-08-29 LAB — PROCALCITONIN: Procalcitonin: 112.58 ng/mL

## 2013-08-29 MED ORDER — SODIUM CHLORIDE 0.9 % IV SOLN
Freq: Once | INTRAVENOUS | Status: AC
  Administered 2013-08-29: 01:00:00 via INTRAVENOUS

## 2013-08-29 MED ORDER — ALTEPLASE 2 MG IJ SOLR
2.0000 mg | Freq: Once | INTRAMUSCULAR | Status: AC
  Administered 2013-08-29: 2 mg
  Filled 2013-08-29: qty 2

## 2013-08-29 MED ORDER — STERILE WATER FOR INJECTION IV SOLN
INTRAVENOUS | Status: DC
  Administered 2013-08-29: 14:00:00 via INTRAVENOUS_CENTRAL
  Filled 2013-08-29 (×8): qty 150

## 2013-08-30 LAB — URINE CULTURE

## 2013-08-30 LAB — TYPE AND SCREEN
ABO/RH(D): A POS
Antibody Screen: NEGATIVE
UNIT DIVISION: 0
Unit division: 0
Unit division: 0

## 2013-08-30 LAB — GLUCOSE, CAPILLARY
Glucose-Capillary: 124 mg/dL — ABNORMAL HIGH (ref 70–99)
Glucose-Capillary: 16 mg/dL — CL (ref 70–99)

## 2013-08-30 NOTE — Progress Notes (Signed)
Utilization Review Completed.Donne Anon T8/24/2015

## 2013-09-03 LAB — CULTURE, BLOOD (ROUTINE X 2): CULTURE: NO GROWTH

## 2013-09-04 MED FILL — Medication: Qty: 1 | Status: AC

## 2013-09-07 NOTE — Progress Notes (Signed)
CRITICAL VALUE ALERT  Critical value received:  Hem 6.4  Date of notification:  2013-09-01  Time of notification:  0024  Critical value read back: yes  Nurse who received alert:  ch  Responding MD:  [email or web address]  Time MD responded: [id number]

## 2013-09-07 NOTE — Progress Notes (Signed)
Patient ID: Wanda Ferguson, female   DOB: 1945-05-21, 68 y.o.   MRN: [id number] S:Hypotensive on multiple pressors, CVVHD.  No family at bedside. O:BP 75/42  Pulse 69  Temp(Src) 97.5 F (36.4 C) (Oral)  Resp 20  Wt 206 lb 5.6 oz (93.6 kg)  SpO2 100%  Intake/Output Summary (Last 24 hours) at 09-13-13 0840 Last data filed at 2013-09-13 0700  Gross per 24 hour  Intake 9141.47 ml  Output   1402 ml  Net 7739.47 ml   Intake/Output: I/O last 3 completed shifts: In: 10966.1 [P.O.:30; I.V.:9311.1; Blood:535; NG/GT:40; IV Piggyback:1050] Out: 2671 [Emesis/NG output:250; Other:1402]  Intake/Output this shift:    Weight change: 86 lb 13.8 oz (39.4 kg) Gen: elderly AAF intubated/sedated CVS:no rub Resp:no wheezing IWP:[id number] BS KNL:ZJQBH edema   Recent Labs Lab 08/11/2013 1147 09/03/2013 2051-04-23 08/23/2013 0047 08/09/2013 0241 09/01/2013 0420 08/31/2013 1200 08/27/2013 1600 08/14/2013 04-23-03 09/13/2013 0428  NA 141 141 144 144 150* 141 141 134* 135*  K 6.8* 6.6* 6.5* 5.9* 4.4 5.8* 5.7* 6.3* 5.8*  CL 98 100 100 99 109 99 97 90* 87*  CO2 13* 11* 12* 12* 16* 13* 13* 14* 16*  GLUCOSE 144* 105* 110* 248* 76 199* 158* 150* 147*  BUN 163* 164* 165* 160* 107* 144* 146* 131* 119*  CREATININE 11.24* 10.91* 10.71* 10.85* 7.02* 9.72* 9.44* 8.58* 7.61*  ALBUMIN 2.3*  --   --   --  2.0*  --  1.6*  --   --   CALCIUM 7.9* 8.4 8.3* 9.3 6.6* 7.6* 7.3* 7.1* 6.7*  PHOS  --  10.8*  --   --  7.4*  --  10.3*  --  10.8*  AST 14  --   --   --   --   --   --   --   --   ALT 6  --   --   --   --   --   --   --   --    Liver Function Tests:  Recent Labs Lab 08/08/2013 1147 08/08/2013 0420 08/17/2013 1600  AST 14  --   --   ALT 6  --   --   ALKPHOS 90  --   --   BILITOT 0.6  --   --   PROT 6.0  --   --   ALBUMIN 2.3* 2.0* 1.6*   No results found for this basename: LIPASE, AMYLASE,  in the last 168 hours No results found for this basename: AMMONIA,  in the last 168 hours CBC:  Recent Labs Lab 08/10/2013 1147   08/09/2013 0241 08/24/2013 1043 09/03/2013 1610 08/16/2013 2302 09-13-2013 0500  WBC 9.3  < > 9.7 7.6 11.0* 12.7* 16.4*  NEUTROABS 7.2  --  7.3  --   --   --   --   HGB 5.6*  < > 8.3* 7.6* 7.3* 6.4* 8.1*  HCT 18.0*  < > 25.1* 22.7* 22.9* 19.6* 25.0*  MCV 75.0*  < > 75.4* 75.7* 76.3* 76.0* 77.2*  PLT 342  < > 309 198 177 106* 82*  < > = values in this interval not displayed. Cardiac Enzymes:  Recent Labs Lab 08/16/2013 04-23-51 08/14/2013 0047 09/06/2013 0241 08/25/2013 0900 08/21/2013 1500  TROPONINI <0.30 <0.30 <0.30 <0.30 <0.30   CBG:  Recent Labs Lab 08/25/2013 1924 08/14/2013 2346 09/04/2013 2347 09/13/13 0344 13-Sep-2013 0707  GLUCAP 135* 16* 139* 125* 170*    Iron Studies:   Recent Labs  08/14/2013 04-23-51  IRON 102  TIBC NOT CALC  FERRITIN 239   Studies/Results: Ct Abdomen Pelvis Wo Contrast  08/09/2013   CLINICAL DATA:  Right-sided hydronephrosis. Past history of uterine cancer.  EXAM: CT CHEST, ABDOMEN AND PELVIS WITHOUT CONTRAST  TECHNIQUE: Multidetector CT imaging of the chest, abdomen and pelvis was performed following the standard protocol without IV contrast.  COMPARISON:  Renal ultrasound 09/06/2013 ; chest x-ray 08/16/2013  FINDINGS: CT CHEST FINDINGS  Mediastinum: Mediastinal adenopathy. An index prevascular node measures 1.2 cm in short axis (image 21 series 21). An index left hilar node measures 1.5 cm in short axis (image 23 series 201) index low right paratracheal node measures 1.6 cm in short axis (image 18 series 201). Moderate hiatal hernia. The entire thoracic esophagus is fluid-filled to the level of the thyroid cartilage. One point 6 cm hypo attenuating lesion in the left thyroid gland is nonspecific by CT but likely represents a thyroid nodule.  Heart/Vascular: Atherosclerotic calcifications in the transverse aorta. No aortic dilatation. Calcifications are present throughout the visualized coronary arteries. The intracardiac blood pool is hypodense relative to the adjacent  myocardium consistent with anemia. The main pulmonary artery is enlarged at 3.5 cm in diameter suggesting pulmonary arterial hypertension. Marked elevation of left hemidiaphragm results in some a local mass effect and compression of the left ventricle.  Lungs/Pleura: Small bilateral layering pleural effusions. Respiratory motion limits evaluation for small pulmonary nodules. However, there are multiple (more than 10) bilateral pulmonary nodules concerning for metastatic disease. Index nodules include a 6 mm nodule in the anterior left upper lobe (image 13 series 202) and a 6 mm nodule in the periphery of the right upper lobe (image 20 series 202). Marked elevation of the left hemidiaphragm with associated atelectasis of the lingula and left lower lobe. Debris present within the left lower lobar bronchus and its subtending segmental bronchi likely contributing to the left lower lobe atelectasis and consolidation.  Bones/Soft Tissues: Abnormal 1.2 cm lymph node in the right axilla concerning for metastatic disease. No acute fracture or aggressive appearing lytic or blastic osseous lesion.  CT ABDOMEN AND PELVIS FINDINGS  Abdomen/Pelvis: The stomach is distended with fluid. The proximal duodenum is unremarkable. Unremarkable appearance of the spleen, adrenal glands. Fatty atrophy of the pancreatic body and tail. Abnormal appearance of the liver. There are multiple (greater than 10) hypo dense lesions throughout the liver concerning for widespread metastatic disease. Index lesions measures 3.2 cm on image 51 of series 201 and 2.7 cm on image 56 of series 201. Branching gas within the anterior aspect of the liver consistent with portal venous gas.  Multiple loops of dilated small bowel throughout the abdomen. In the left mid abdomen there is a loop of small bowel with extensive pneumatosis. There are a few small locular of adjacent air which may be extraluminal although this is not definitive. It is difficult to assess  for transition points given the ascites, mesenteric edema and absence of both intravenous and oral contrast material.  The gallbladder is distended and measures approximately 5 mm in diameter. No definite cholelithiasis. Moderate volume ascites throughout the abdomen. Severe right-sided hydronephrosis. Of note, the right kidney is relatively atrophic and this is suspected to be a longstanding process. 8 mm hypo attenuating lesion in the interpolar right kidney is incompletely characterized without intravenous contrast. Numerous hypo attenuating lesions throughout the left kidney are incompletely characterized in the absence of intravenous contrast material.  Large right retroperitoneal mass measures 8.4 x 10.5 by 11.9 cm.  This is the underlying cause of the patient's right-sided hydronephrosis. Additional metastatic right periaortic retroperitoneal adenopathy. Soft tissue density in the omentum in the low anterior abdomen concerning for omental caking. There is an omental fat and fluid containing paraumbilical hernia. Left-sided periaortic adenopathy also noted. Nonspecific circumferential thickening of the rectal wall concerning for proctitis. There is nonspecific presacral edema. Surgical changes of prior hysterectomy. The bladder is decompressed and thick walled.  Bones/Soft Tissues: Extensive anasarca. Nonspecific small sclerotic focus in the left ischial tuberosity. Nonspecific small sclerotic focus in the right sacral ala. No acute fracture or malalignment. Prominent Schmorl's node in the superior endplate of L3. T5 hemangioma. The bones are diffusely osteopenic.  Vascular: Limited evaluation in the absence of intravenous contrast. Scattered atherosclerotic vascular calcifications.  IMPRESSION: 1. High-grade small bowel obstruction with pneumatosis and portal venous gas highly concerning for small bowel ischemia and necrosis. Transition point is difficult to evaluate in the absence of both intravenous and oral  contrast and a moderate ascites and likely peritoneal metastatic disease. 2. Widespread metastatic disease including lungs, liver, omentum/peritoneum, and multiple nodal stations (retroperitoneal, mediastinal and right axillary). The dominant mass is a 12 cm right retroperitoneal mass/nodal conglomerate which obstructs the right ureter. Given the patient's past history of uterine cancer and the dominant mass in the right retroperitoneum, metastatic uterine cancer is suspected. There are a few nonspecific sclerotic foci in the left ischium and right sacral ala which could conceivably represent additional foci of metastatic disease. 3. Severe right hydronephrosis with marked cortical thinning. Cortical thinning suggests that the hydronephrosis is longstanding. 4. Moderate volume peritoneal ascites may be a combination of a malignant ascites and inflammatory fluid related to the patient's suspected small bowel ischemic necrosis. 5. Debris within the left lower lobe pulmonary bronchus concerning for aspiration. There is associated left lower lobe atelectasis. 6. The entire thoracic esophagus is dilated and fluid-filled concerning for reflux an concerning for continued aspiration risk. Consider placement of nasogastric or orogastric tube. 7. The gallbladder is distended nearly 5 cm. No definite cholelithiasis. 8. Anasarca, small bilateral pleural effusions. 9. Enlarged main pulmonary artery as can be seen with pulmonary arterial hypertension. 10. Atherosclerosis including multifocal coronary artery disease. 11. Additional ancillary findings as above.  Critical Value/emergent results were called by telephone at the time of interpretation on 08/09/2013 at 7:40 am to the 33M charge nurse,Teresa Crite, who verbally acknowledged these results and will convey to the incoming Critical Care physician.   Electronically Signed   By: Jacqulynn Cadet M.D.   On: 09/03/2013 08:00   Ct Chest Wo Contrast  09/05/2013   CLINICAL DATA:   Right-sided hydronephrosis. Past history of uterine cancer.  EXAM: CT CHEST, ABDOMEN AND PELVIS WITHOUT CONTRAST  TECHNIQUE: Multidetector CT imaging of the chest, abdomen and pelvis was performed following the standard protocol without IV contrast.  COMPARISON:  Renal ultrasound 08/13/2013 ; chest x-ray 08/28/2013  FINDINGS: CT CHEST FINDINGS  Mediastinum: Mediastinal adenopathy. An index prevascular node measures 1.2 cm in short axis (image 21 series 21). An index left hilar node measures 1.5 cm in short axis (image 23 series 201) index low right paratracheal node measures 1.6 cm in short axis (image 18 series 201). Moderate hiatal hernia. The entire thoracic esophagus is fluid-filled to the level of the thyroid cartilage. One point 6 cm hypo attenuating lesion in the left thyroid gland is nonspecific by CT but likely represents a thyroid nodule.  Heart/Vascular: Atherosclerotic calcifications in the transverse aorta. No aortic dilatation. Calcifications  are present throughout the visualized coronary arteries. The intracardiac blood pool is hypodense relative to the adjacent myocardium consistent with anemia. The main pulmonary artery is enlarged at 3.5 cm in diameter suggesting pulmonary arterial hypertension. Marked elevation of left hemidiaphragm results in some a local mass effect and compression of the left ventricle.  Lungs/Pleura: Small bilateral layering pleural effusions. Respiratory motion limits evaluation for small pulmonary nodules. However, there are multiple (more than 10) bilateral pulmonary nodules concerning for metastatic disease. Index nodules include a 6 mm nodule in the anterior left upper lobe (image 13 series 202) and a 6 mm nodule in the periphery of the right upper lobe (image 20 series 202). Marked elevation of the left hemidiaphragm with associated atelectasis of the lingula and left lower lobe. Debris present within the left lower lobar bronchus and its subtending segmental bronchi  likely contributing to the left lower lobe atelectasis and consolidation.  Bones/Soft Tissues: Abnormal 1.2 cm lymph node in the right axilla concerning for metastatic disease. No acute fracture or aggressive appearing lytic or blastic osseous lesion.  CT ABDOMEN AND PELVIS FINDINGS  Abdomen/Pelvis: The stomach is distended with fluid. The proximal duodenum is unremarkable. Unremarkable appearance of the spleen, adrenal glands. Fatty atrophy of the pancreatic body and tail. Abnormal appearance of the liver. There are multiple (greater than 10) hypo dense lesions throughout the liver concerning for widespread metastatic disease. Index lesions measures 3.2 cm on image 51 of series 201 and 2.7 cm on image 56 of series 201. Branching gas within the anterior aspect of the liver consistent with portal venous gas.  Multiple loops of dilated small bowel throughout the abdomen. In the left mid abdomen there is a loop of small bowel with extensive pneumatosis. There are a few small locular of adjacent air which may be extraluminal although this is not definitive. It is difficult to assess for transition points given the ascites, mesenteric edema and absence of both intravenous and oral contrast material.  The gallbladder is distended and measures approximately 5 mm in diameter. No definite cholelithiasis. Moderate volume ascites throughout the abdomen. Severe right-sided hydronephrosis. Of note, the right kidney is relatively atrophic and this is suspected to be a longstanding process. 8 mm hypo attenuating lesion in the interpolar right kidney is incompletely characterized without intravenous contrast. Numerous hypo attenuating lesions throughout the left kidney are incompletely characterized in the absence of intravenous contrast material.  Large right retroperitoneal mass measures 8.4 x 10.5 by 11.9 cm. This is the underlying cause of the patient's right-sided hydronephrosis. Additional metastatic right periaortic  retroperitoneal adenopathy. Soft tissue density in the omentum in the low anterior abdomen concerning for omental caking. There is an omental fat and fluid containing paraumbilical hernia. Left-sided periaortic adenopathy also noted. Nonspecific circumferential thickening of the rectal wall concerning for proctitis. There is nonspecific presacral edema. Surgical changes of prior hysterectomy. The bladder is decompressed and thick walled.  Bones/Soft Tissues: Extensive anasarca. Nonspecific small sclerotic focus in the left ischial tuberosity. Nonspecific small sclerotic focus in the right sacral ala. No acute fracture or malalignment. Prominent Schmorl's node in the superior endplate of L3. T5 hemangioma. The bones are diffusely osteopenic.  Vascular: Limited evaluation in the absence of intravenous contrast. Scattered atherosclerotic vascular calcifications.  IMPRESSION: 1. High-grade small bowel obstruction with pneumatosis and portal venous gas highly concerning for small bowel ischemia and necrosis. Transition point is difficult to evaluate in the absence of both intravenous and oral contrast and a moderate ascites and likely  peritoneal metastatic disease. 2. Widespread metastatic disease including lungs, liver, omentum/peritoneum, and multiple nodal stations (retroperitoneal, mediastinal and right axillary). The dominant mass is a 12 cm right retroperitoneal mass/nodal conglomerate which obstructs the right ureter. Given the patient's past history of uterine cancer and the dominant mass in the right retroperitoneum, metastatic uterine cancer is suspected. There are a few nonspecific sclerotic foci in the left ischium and right sacral ala which could conceivably represent additional foci of metastatic disease. 3. Severe right hydronephrosis with marked cortical thinning. Cortical thinning suggests that the hydronephrosis is longstanding. 4. Moderate volume peritoneal ascites may be a combination of a malignant  ascites and inflammatory fluid related to the patient's suspected small bowel ischemic necrosis. 5. Debris within the left lower lobe pulmonary bronchus concerning for aspiration. There is associated left lower lobe atelectasis. 6. The entire thoracic esophagus is dilated and fluid-filled concerning for reflux an concerning for continued aspiration risk. Consider placement of nasogastric or orogastric tube. 7. The gallbladder is distended nearly 5 cm. No definite cholelithiasis. 8. Anasarca, small bilateral pleural effusions. 9. Enlarged main pulmonary artery as can be seen with pulmonary arterial hypertension. 10. Atherosclerosis including multifocal coronary artery disease. 11. Additional ancillary findings as above.  Critical Value/emergent results were called by telephone at the time of interpretation on 08/31/2013 at 7:40 am to the 45M charge nurse,Teresa Crite, who verbally acknowledged these results and will convey to the incoming Critical Care physician.   Electronically Signed   By: Jacqulynn Cadet M.D.   On: 08/16/2013 08:00   US Renal  08/31/2013   CLINICAL DATA:  Acute renal failure  EXAM: RENAL/URINARY TRACT ULTRASOUND COMPLETE  COMPARISON:  None.  FINDINGS: Right Kidney:  Length: 12.7 cm. Echogenicity is increased. There is renal cortical thinning. There is severe hydronephrosis and proximal ureterectasis. No calculus is seen. There is a cyst arising from the upper pole of the right kidney measuring 0.9 x 0.8 x 0.9 cm. No other renal mass. No perinephric fluid.  Left Kidney:  Length: 8.8 cm. Echogenicity is increased. Renal cortical thickness is within normal limits. There is no pelvicaliectasis. There is no perinephric fluid. There is a cyst in the mid kidney measuring 1.1 x 0.9 x 1.0 cm. There is no sonographically demonstrable calculus or ureterectasis.  Bladder:  Appears normal for degree of bladder distention.  There is moderate generalized ascites.  IMPRESSION: Severe hydronephrosis and  proximal ureterectasis on the right. No hydronephrosis on the left. Kidneys are both echogenic consistent with medical renal disease. There is renal cortical thinning on the right.  There is size discrepancy between left and right kidneys. This finding raises question of renal artery stenosis on the left. In this regard, question whether the patient is hypertensive.  Moderate generalized ascites is present.   Electronically Signed   By: Lowella Grip M.D.   On: 09/02/2013 18:09   Dg Chest Port 1 View  08/10/2013   CLINICAL DATA:  Code.  Emergent intubation  EXAM: PORTABLE CHEST - 1 VIEW  COMPARISON:  08/19/2013  FINDINGS: Endotracheal tube ends at the clavicular heads. Orogastric tube enters the proximal stomach, with side port near the GE junction.  Cardiomegaly. Negative upper mediastinal contours. Extensive coronary atherosclerotic calcification.  There is elevation of the left diaphragm of uncertain chronicity. Bilateral perihilar airspace opacities. No pneumothorax.  IMPRESSION: 1. Endotracheal tube in good position. 2. Orogastric tube tip at the proximal stomach. Advancement by 5 cm would provide more secure positioning. 3. Low lung volumes with  perihilar opacity, favor pulmonary edema. Aspiration could have a similar appearance given history of coding.   Electronically Signed   By: Jorje Guild M.D.   On: 08/11/2013 07:03   Dg Abd Acute W/chest  08/26/2013   CLINICAL DATA:  Abdominal pain and constipation.  EXAM: ACUTE ABDOMEN SERIES (ABDOMEN 2 VIEW & CHEST 1 VIEW)  COMPARISON:  None.  FINDINGS: Lung volumes are markedly low with some basilar atelectasis. A 0.9 cm nodular opacity is seen in the left upper lobe. A second nodular opacity in the right mid lung measures 1.0 cm. Heart size appears enlarged. Right paratracheal soft tissue fullness is identified.  Two views of the abdomen show no free intraperitoneal air. There is gaseous distention of small bowel loops of the 4.6 cm. A large volume of  stool is seen throughout the colon.  IMPRESSION: Bilateral nodular opacities in the chest and fullness of the right paratracheal stripe. CT chest with contrast is recommended for further evaluation.  Abnormal bowel gas pattern could be due to partial small bowel obstruction or ileus.  Large volume of stool throughout the colon.   Electronically Signed   By: Inge Rise M.D.   On: 08/28/2013 12:58   Dg Abd Portable 1v  08/21/2013   CLINICAL DATA:  Ileus.  EXAM: PORTABLE ABDOMEN - 1 VIEW  COMPARISON:  CT same date.  FINDINGS: 0621 hr. Nasogastric tube extends into the left upper quadrant of the abdomen, likely within the proximal stomach. There are bilateral femoral lines. The left femoral line is partially looped within the pelvis. There is persistent diffuse small bowel distension. Pneumatosis and portal venous gas demonstrated on CT are not well visualized. There is no supine evidence of pneumoperitoneum.  IMPRESSION: 1. No significant change in diffuse small bowel distension. 2. Support system as described. The left femoral line is partially looped within the pelvis and should be repositioned.   Electronically Signed   By: Camie Patience M.D.   On: 08/28/2013 08:50   . antiseptic oral rinse  7 mL Mouth Rinse QID  . aspirin EC  81 mg Oral QHS  . ceFEPime (MAXIPIME) IV  2 g Intravenous Q12H  . chlorhexidine  15 mL Mouth Rinse BID  . Chlorhexidine Gluconate Cloth  6 each Topical Q0600  . heparin  5,000 Units Subcutaneous 3 times per day  . hydrocortisone sod succinate (SOLU-CORTEF) inj  50 mg Intravenous Q6H  . insulin aspart  0-9 Units Subcutaneous TID WC  . mupirocin ointment  1 application Nasal BID  . pantoprazole (PROTONIX) IV  40 mg Intravenous Q24H  . sodium bicarbonate  25 mEq Intravenous Once  . sodium chloride  3 mL Intravenous Q12H  . vancomycin  750 mg Intravenous Q24H    BMET    Component Value Date/Time   NA 135* 09-28-13 0428   K 5.8* September 28, 2013 0428   CL 87* 28-Sep-2013 0428    CO2 16* 28-Sep-2013 0428   GLUCOSE 147* 09-28-13 0428   BUN 119* 09-28-2013 0428   CREATININE 7.61* 09/28/2013 0428   CALCIUM 6.7* 09/28/2013 0428   GFRNONAA 5* 2013-09-28 0428   GFRAA 6* 09-28-2013 0428   CBC    Component Value Date/Time   WBC 16.4* 09/28/2013 0500   RBC 3.24* 2013/09/28 0500   HGB 8.1* 2013/09/28 0500   HCT 25.0* 09/28/13 0500   PLT 82* 09/28/13 0500   MCV 77.2* September 28, 2013 0500   MCH 25.0* 28-Sep-2013 0500   MCHC 32.4 2013-09-28 0500   RDW  17.1* 09/26/2013 0500   LYMPHSABS 2.1 08/11/2013 0241   MONOABS 0.2 09/06/2013 0241   EOSABS 0.0 08/24/2013 0241   BASOSABS 0.0 08/17/2013 0241   BCx: 1/2 G+ cocci in clusters [id number]  Abx: Vancomycin 8/21- Ceftriaxone 8/21-8/21 Cefepime 8/22--  Assessment/Plan:  1. SIRS/Cardiac arrest- s/p CPR, intubation, now on epi, levophed, neosynephrine and vasopressin. 2. H/o uterine cancer without chemo- now appears to be metastatic 3. High grade SBO with pneumatosis/suspicion of necrosis- poor prognosis, would recommend CMO. 4. AKI/CKD-  Secondary to obstruction from recurrent mass, (likely recurrent uterine cancer).  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   5. Hyperkalemia- K+ 5.8 this AM.  Correcting with CVVHD 6. Metabolic acidosis- multifactorial: AKI/CKD, SBO and likely ischemic bowel/necrosis.  Improving Bicab to 16 this AM. However Lactic Acid this AM is 17. 1. Will change all replacement fluid to isotonic bicarb.  This is likely due to worsening ischemia due to ongoing need for multiple pressors 7. VDRF- per PCCM 8. Anemia- likely secondary to malignancy and CKD.  S/p blood transfusion 9. Dispo- very grim prognosis and patient will not survive this hospitalization (widely metastatic malignancy with obstruction of right kidney, omental caking, SBO causing ischemia/necrosis, ongoing hypotension despite pressor support).  Recommend palliative care/hospice  for CMO.  Family not present at this time for further discussion.  Lucious Groves Internal Medicine PGY-2  I have seen and examined this patient and agree with plan as outlined by Dr. Heber Paradise.  Mrs. Alleyne continues to deteriorate and will not likely survive the day.  Cont with supportive care for now but would recommend changing code status and not add further futile measures. Madi Bonfiglio A,MD 2013/09/26 9:25 AM

## 2013-09-07 NOTE — Progress Notes (Signed)
PCCM Interval Note  I have met with Ms Hunton's family - her daughter, 2 sons and others. They understand her situation, grave prognosis and progressive decline despite maximum support. The rapid decline and the new dx of cancer recurrence make things much more difficult for them to process. I have explained that her BP is going down now despite full support and that I recommend we stop interventions that are not keeping her stable, specifically her pressors. Before making any changes I will increase her empiric fentanyl. I will change her code status to DNR, liberalize visitation and offer family support.   30 minutes CC time  Baltazar Apo, MD, PhD September 17, 2013, 3:41 PM [facility] [phone] or if no answer [phone]

## 2013-09-07 NOTE — Progress Notes (Signed)
PULMONARY / CRITICAL CARE MEDICINE   Name: Wanda Ferguson MRN: [id number] DOB: 1945/05/03    ADMISSION DATE:  08/08/2013 CONSULTATION DATE:  8/21  REFERRING MD :  [facility]  INITIAL PRESENTATION:  74F with hx of DM, Htn and CKD (followed @ [facility]) admitted initially to [facility] service via ED with CC of weakness and fatigue and found to have worsening renal function, markedly elevated BUN and Cr, hyperkalemia(6.8), severe anemia (Hgb 5.6) and hypotension that improved with initiation of PRBCs. Due to constellation of problems, it was felt that she should be admitted to ICU. Therefore PCCM consult obtained  STUDIES:  8/21 Renal US: Severe hydronephrosis and proximal ureterectasis on the right. No hydronephrosis on the left. Kidneys are both echogenic consistent with medical renal disease. There is renal cortical thinning on the right. 8/22 CT chest/abd/pelvis>>> WIDESPREAD metastatic disease (hx uterine ca) including liver, lungs, omentum/peritoneum. Dominant mass is 12cm R retroperitoneal mass obstructing R ureter.  High grade small bowel obstruction, concern for small bowel ischemia/necrosis.  Severe R hydronephrosis, ascites, LLL debris/aspiration.    SIGNIFICANT EVENTS: 8/22 bradycardia/PEA arrest (43min) during CT, widespread mets, shock, max pressors  8/23 shock, SBP 50's despite max pressors (levo, epi, neo)  SUBJECTIVE:  Worsening acidosis despite CVVH, HCO3.  On max pressors with SBP 50's  VITAL SIGNS: Temp:  [97.4 F (36.3 C)-97.8 F (36.6 C)] 97.5 F (36.4 C) (08/23 0727) Pulse Rate:  [63-71] 69 (08/23 0823) Resp:  [18-33] 20 (08/23 0930) BP: (58-113)/(31-102) 75/42 mmHg (08/23 0823) SpO2:  [75 %-100 %] 100 % (08/22 1011) Arterial Line BP: (45-108)/(31-49) 45/31 mmHg (08/23 0930) FiO2 (%):  [100 %] 100 % (08/23 0900) Weight:  [206 lb 5.6 oz (93.6 kg)] 206 lb 5.6 oz (93.6 kg) (08/23 0445) HEMODYNAMICS:   VENTILATOR SETTINGS: Vent Mode:  [-] PRVC FiO2 (%):  [100 %] 100 % Set  Rate:  [20 bmp] 20 bmp Vt Set:  [500 mL] 500 mL PEEP:  [5 cmH20] 5 cmH20 Plateau Pressure:  [20 cmH20-25 cmH20] 24 cmH20 INTAKE / OUTPUT:  Intake/Output Summary (Last 24 hours) at Sep 08, 2013 4315 Last data filed at 2013-09-08 0700  Gross per 24 hour  Intake 8721.97 ml  Output   1402 ml  Net 7319.97 ml    PHYSICAL EXAMINATION: General:  Critically ill, max support, dying   Neuro:no response on vent, does not follow commands HEENT:  NCAT, PERRL Cardiovascular: bradycardic Lungs: resps even non labored on vent, few scattered rhonchi  Abdomen: obese, soft, NT, NABS Ext: cool, mottled, trace symmetric edema  LABS:  CBC  Recent Labs Lab 08/15/2013 1610 08/09/2013 2302 2013-09-08 0500  WBC 11.0* 12.7* 16.4*  HGB 7.3* 6.4* 8.1*  HCT 22.9* 19.6* 25.0*  PLT 177 106* 82*   Coag's  Recent Labs Lab 08/24/2013 0419 2013/09/08 0428  APTT  --  61*  INR 1.56*  --    BMET  Recent Labs Lab 08/23/2013 1600 08/26/2013 2105 09-08-2013 0428  NA 141 134* 135*  K 5.7* 6.3* 5.8*  CL 97 90* 87*  CO2 13* 14* 16*  BUN 146* 131* 119*  CREATININE 9.44* 8.58* 7.61*  GLUCOSE 158* 150* 147*   Electrolytes  Recent Labs Lab 08/22/2013 0241 09/02/2013 0419 08/27/2013 0420  08/16/2013 1600 08/26/2013 2105 08-Sep-2013 0428  CALCIUM 9.3  --  6.6*  < > 7.3* 7.1* 6.7*  MG  --  1.9  --   --   --   --  2.1  PHOS  --   --  7.4*  --  10.3*  --  10.8*  < > = values in this interval not displayed. Sepsis Markers  Recent Labs Lab 08/26/2013 1200 09/06/2013 0240  08/27/2013 2105 09/16/13 0428 09-16-13 0823  LATICACIDVEN 1.00  --   < > 9.0* 11.9* 17.0*  PROCALCITON  --  21.35  --   --  112.58  --   < > = values in this interval not displayed. ABG  Recent Labs Lab 08/25/2013 0233 09/04/2013 0439  PHART 7.148* 7.234*  PCO2ART 43.3 29.2*  PO2ART 317.0* 78.2*   Liver Enzymes  Recent Labs Lab 08/13/2013 1147 09/04/2013 0420 08/21/2013 1600  AST 14  --   --   ALT 6  --   --   ALKPHOS 90  --   --   BILITOT 0.6  --    --   ALBUMIN 2.3* 2.0* 1.6*   Cardiac Enzymes  Recent Labs Lab 09/05/2013 0241 09/04/2013 0900 08/14/2013 1500  TROPONINI <0.30 <0.30 <0.30   Glucose  Recent Labs Lab 09/06/2013 1513 08/21/2013 1924 08/18/2013 2346 08/28/13 2347 2013-09-16 0344 09/16/2013 0707  GLUCAP 147* 135* 16* 139* 125* 170*      ASSESSMENT / PLAN:  PULMONARY A: Acute respiratory failure - post arrest  Probable aspiration PNA  Widespread metastatic disease  P:   Vent support  F/u ABG  F/u CXR  See discussion below    CARDIOVASCULAR A:  Cardiac arrest  Hypotension/ shock P:  Monitor BP, rhythm Max support    RENAL A:   Stage V CKD AKI - r/t obstructive etiology in setting widespread cancer Severe R hydronephrosis  Acute on chronic metabolic acidosis due to renal failure - worsening.  Lactate >17 Hyperkalemia P:   Renal following  Cont CRRT for now  Monitor BMET closely Monitor I/Os   GASTROINTESTINAL A:   Suspect small bowel ischemia/ necrosis  P:   NPO  Medical support as above  Not surgical candidate at this time with significant hemodynamic instability   HEMATOLOGIC A:   Acute on chronic anemia without overt bleeding noted P:  See discussion   INFECTIOUS A:  No overt infections identified Urine 8/21 >>  Blood 8/21 >>  P:   Monitor temp, WBC count Micro and abx as above  ENDOCRINE A:   DM2   P:  SSI   NEUROLOGIC A:   Adult FTT due to worsening renal failure P:   RASS goal: 0 Minimize sedation/opioids  TODAY'S SUMMARY:   Likely will not survive the day.  Actively dying despite max support (CVVHD, epi gtt, levophed gtt, vasopressin).  Renal following.  No family at bedside.  They are having trouble processing as pt was "fine" when she came in and their impression was that cancer was resolved. Suspect worsening bowel ischemia/necorsis with worsening acidosis and shock.  Need to discuss withdrawal with family.  Resuscitation would be futile at this point.     CRITICAL CARE: The patient is critically ill with multiple organ systems failure and requires high complexity decision making for assessment and support, frequent evaluation and titration of therapies, application of advanced monitoring technologies and extensive interpretation of multiple databases. Critical Care Time devoted to patient care services described in this note is 45 minutes.   Nickolas Madrid, NP 09-16-13  9:38 AM Pager: [phone] or [phone]  *Care during the described time interval was provided by me and/or other providers on the critical care team. I have reviewed this patient's available data, including medical history,  events of note, physical examination and test results as part of my evaluation.  Baltazar Apo, MD, PhD 12-Sep-2013, 3:36 PM [facility] [phone] or if no answer [phone]

## 2013-09-07 DEATH — deceased

## 2013-09-29 NOTE — Discharge Summary (Signed)
PULMONARY / CRITICAL CARE MEDICINE   Name: Wanda Ferguson MRN: [id number] DOB: 11/20/1945    ADMISSION DATE:  09-18-2013 DATE OF DEATH: 09-20-13  FINAL CAUSE OF DEATH:  Septic Shock  SECONDARY CAUSES OF DEATH:  Widely metastatic Uterine Cancer Acute obstructive nephropathy and acute renal failure Bilateral tube nephrostomies Metabolic acidosis Hyperkalemia Anemia Respiratory arrest leading to cardiopulmonary arrest Small bowel obstruction  Concern for small bowel ischemia LLL pneumonia   INITIAL PRESENTATION:  72F with hx of DM, Htn and CKD (followed @ [facility]) admitted initially to [facility] service via ED with CC of weakness and fatigue and found to have worsening renal function, markedly elevated BUN and Cr, hyperkalemia(6.8), severe anemia (Hgb 5.6) and hypotension that improved with initiation of PRBCs. Due to constellation of problems, it was felt that she should be admitted to ICU.  She underwent Ct abdomen / pelvis and experienced resp arrest and then cardiac arrest in the scanner. She was intubated, supported with pressors. The CT revealed widely metastatic uterine cancer with hydronephrosis and small bowel obstruction, possible bowel ischemia. The pt required escalating doses of pressors. B nephrostomy tubes were placed. The pt's pressor needs and vent requirements went up even with maximal therapy. Discussion was undertaken with family and decision was made to defer CPR since all aggressive treatments were being exhausted. She continued to decline and decision was then made to concentrate on palliation, discontinue pressors. She died on 2013/09/20.   STUDIES:  September 19, 2022 Renal US: Severe hydronephrosis and proximal ureterectasis on the right. No hydronephrosis on the left. Kidneys are both echogenic consistent with medical renal disease. There is renal cortical thinning on the right. 8/22 CT chest/abd/pelvis>>> WIDESPREAD metastatic disease (hx uterine ca) including liver, lungs,  omentum/peritoneum. Dominant mass is 12cm R retroperitoneal mass obstructing R ureter.  High grade small bowel obstruction, concern for small bowel ischemia/necrosis.  Severe R hydronephrosis, ascites, LLL debris/aspiration.    SIGNIFICANT EVENTS: 8/22 bradycardia/PEA arrest (62min) during CT, widespread mets, shock, max pressors  09-21-2022 shock, SBP 50's despite max pressors (levo, epi, neo)   Baltazar Apo, MD, PhD 09/29/2013, 12:10 AM [facility] [phone] or if no answer [phone]

## 2015-12-02 IMAGING — CR DG ABD PORTABLE 1V
1 series · 1 of 1 positions shown · non-contrast
Comparison: CT same date.

CLINICAL DATA: Ileus.

EXAM:
PORTABLE ABDOMEN - 1 VIEW

[AP]
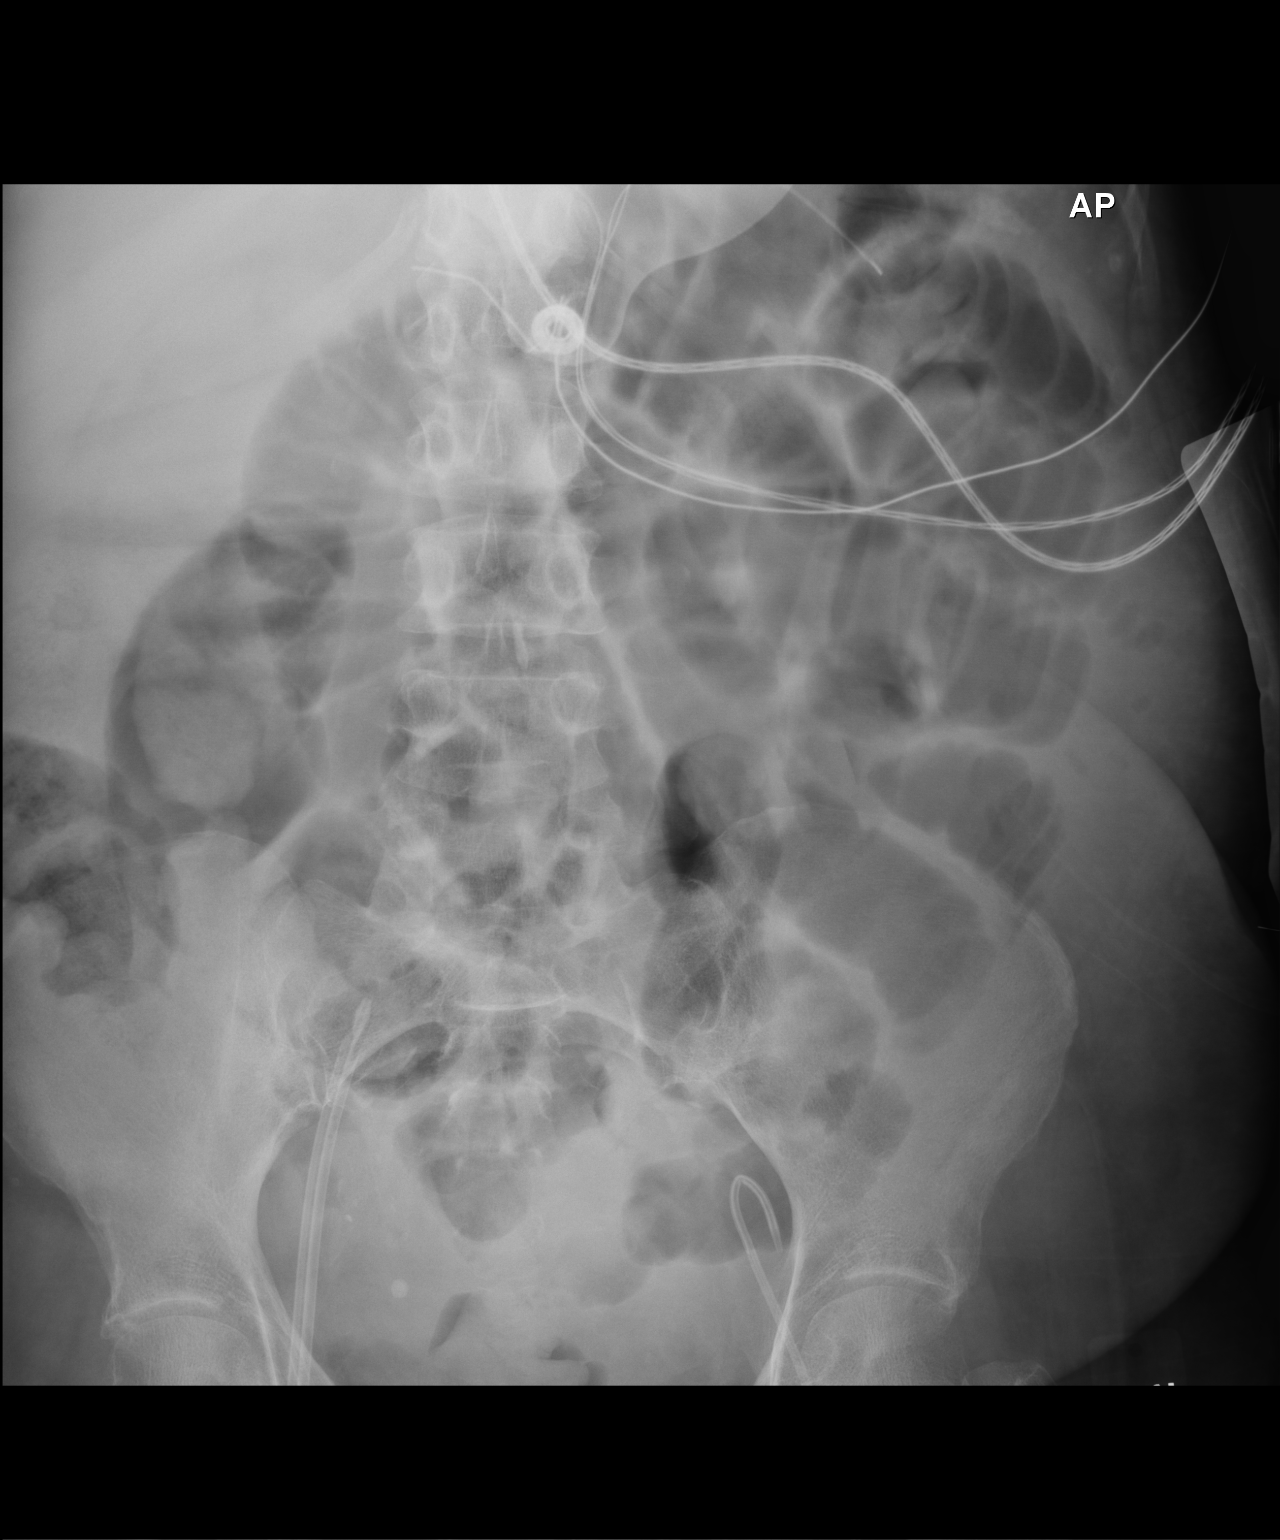

[1 of 1 positions shown; findings below may reference images not displayed]

FINDINGS: 5102 hr. Nasogastric tube extends into the left upper quadrant of
the abdomen, likely within the proximal stomach. There are bilateral
femoral lines. The left femoral line is partially looped within the
pelvis. There is persistent diffuse small bowel distension.
Pneumatosis and portal venous gas demonstrated on CT are not well
visualized. There is no supine evidence of pneumoperitoneum.
IMPRESSION: 1. No significant change in diffuse small bowel distension.
2. Support system as described. The left femoral line is partially
looped within the pelvis and should be repositioned.
